# Patient Record
Sex: Female | Born: 1965 | Race: White | Hispanic: No | Marital: Single | Smoking: Former smoker
Health system: Southern US, Community
[De-identification: ages and names within clinical notes are randomized; demographics above are authoritative.]

## PROBLEM LIST (undated history)

## (undated) DIAGNOSIS — R519 Headache, unspecified: Secondary | ICD-10-CM

## (undated) DIAGNOSIS — G4730 Sleep apnea, unspecified: Secondary | ICD-10-CM

## (undated) DIAGNOSIS — K219 Gastro-esophageal reflux disease without esophagitis: Secondary | ICD-10-CM

---

## 1988-10-25 HISTORY — PX: ECTOPIC PREGNANCY SURGERY: SHX613

## 2021-06-22 ENCOUNTER — Emergency Department (HOSPITAL_COMMUNITY): Payer: BC Managed Care – PPO

## 2021-06-22 ENCOUNTER — Other Ambulatory Visit: Payer: Self-pay

## 2021-06-22 ENCOUNTER — Inpatient Hospital Stay (HOSPITAL_COMMUNITY)
Admission: EM | Admit: 2021-06-22 | Discharge: 2021-07-12 | DRG: 521 | Disposition: A | Discharge status: Rehabilitation Facility | Payer: BC Managed Care – PPO | Attending: Orthopaedic Surgery | Admitting: Orthopaedic Surgery

## 2021-06-22 DIAGNOSIS — S81802A Unspecified open wound, left lower leg, initial encounter: Secondary | ICD-10-CM

## 2021-06-22 DIAGNOSIS — S82401C Unspecified fracture of shaft of right fibula, initial encounter for open fracture type IIIA, IIIB, or IIIC: Secondary | ICD-10-CM

## 2021-06-22 DIAGNOSIS — K5903 Drug induced constipation: Secondary | ICD-10-CM | POA: Diagnosis present

## 2021-06-22 DIAGNOSIS — S82201C Unspecified fracture of shaft of right tibia, initial encounter for open fracture type IIIA, IIIB, or IIIC: Secondary | ICD-10-CM

## 2021-06-22 DIAGNOSIS — Z6841 Body Mass Index (BMI) 40.0 and over, adult: Secondary | ICD-10-CM

## 2021-06-22 DIAGNOSIS — D62 Acute posthemorrhagic anemia: Secondary | ICD-10-CM | POA: Diagnosis not present

## 2021-06-22 DIAGNOSIS — G4733 Obstructive sleep apnea (adult) (pediatric): Secondary | ICD-10-CM | POA: Diagnosis present

## 2021-06-22 DIAGNOSIS — I82441 Acute embolism and thrombosis of right tibial vein: Secondary | ICD-10-CM | POA: Diagnosis present

## 2021-06-22 DIAGNOSIS — Z419 Encounter for procedure for purposes other than remedying health state, unspecified: Secondary | ICD-10-CM

## 2021-06-22 DIAGNOSIS — Z23 Encounter for immunization: Secondary | ICD-10-CM

## 2021-06-22 DIAGNOSIS — Z96642 Presence of left artificial hip joint: Secondary | ICD-10-CM

## 2021-06-22 DIAGNOSIS — G8918 Other acute postprocedural pain: Secondary | ICD-10-CM | POA: Diagnosis not present

## 2021-06-22 DIAGNOSIS — S72002A Fracture of unspecified part of neck of left femur, initial encounter for closed fracture: Secondary | ICD-10-CM | POA: Diagnosis not present

## 2021-06-22 DIAGNOSIS — Z20822 Contact with and (suspected) exposure to covid-19: Secondary | ICD-10-CM | POA: Diagnosis present

## 2021-06-22 DIAGNOSIS — Z87891 Personal history of nicotine dependence: Secondary | ICD-10-CM

## 2021-06-22 DIAGNOSIS — K219 Gastro-esophageal reflux disease without esophagitis: Secondary | ICD-10-CM | POA: Diagnosis present

## 2021-06-22 DIAGNOSIS — T148XXA Other injury of unspecified body region, initial encounter: Secondary | ICD-10-CM

## 2021-06-22 DIAGNOSIS — T1490XA Injury, unspecified, initial encounter: Secondary | ICD-10-CM | POA: Diagnosis not present

## 2021-06-22 DIAGNOSIS — Y92488 Other paved roadways as the place of occurrence of the external cause: Secondary | ICD-10-CM

## 2021-06-22 DIAGNOSIS — Z9889 Other specified postprocedural states: Secondary | ICD-10-CM

## 2021-06-22 DIAGNOSIS — S82251C Displaced comminuted fracture of shaft of right tibia, initial encounter for open fracture type IIIA, IIIB, or IIIC: Secondary | ICD-10-CM | POA: Diagnosis present

## 2021-06-22 DIAGNOSIS — S82451C Displaced comminuted fracture of shaft of right fibula, initial encounter for open fracture type IIIA, IIIB, or IIIC: Secondary | ICD-10-CM | POA: Diagnosis present

## 2021-06-22 HISTORY — DX: Sleep apnea, unspecified: G47.30

## 2021-06-22 HISTORY — DX: Headache, unspecified: R51.9

## 2021-06-22 HISTORY — DX: Gastro-esophageal reflux disease without esophagitis: K21.9

## 2021-06-22 LAB — COMPREHENSIVE METABOLIC PANEL
ALT: 45 U/L — ABNORMAL HIGH (ref 0–44)
AST: 38 U/L (ref 15–41)
Albumin: 3.3 g/dL — ABNORMAL LOW (ref 3.5–5.0)
Alkaline Phosphatase: 69 U/L (ref 38–126)
Anion gap: 11 (ref 5–15)
BUN: 16 mg/dL (ref 6–20)
CO2: 23 mmol/L (ref 22–32)
Calcium: 8.5 mg/dL — ABNORMAL LOW (ref 8.9–10.3)
Chloride: 104 mmol/L (ref 98–111)
Creatinine, Ser: 1.25 mg/dL — ABNORMAL HIGH (ref 0.44–1.00)
GFR, Estimated: 50 mL/min — ABNORMAL LOW (ref >60)
Glucose, Bld: 220 mg/dL — ABNORMAL HIGH (ref 70–99)
Potassium: 3.7 mmol/L (ref 3.5–5.1)
Sodium: 138 mmol/L (ref 135–145)
Total Bilirubin: 0.7 mg/dL (ref 0.3–1.2)
Total Protein: 6.2 g/dL — ABNORMAL LOW (ref 6.5–8.1)

## 2021-06-22 LAB — RESP PANEL BY RT-PCR (FLU A&B, COVID) ARPGX2
Influenza A by PCR: NEGATIVE
Influenza B by PCR: NEGATIVE
SARS Coronavirus 2 by RT PCR: NEGATIVE

## 2021-06-22 LAB — CBC
HCT: 42.9 % (ref 36.0–46.0)
Hemoglobin: 13.5 g/dL (ref 12.0–15.0)
MCH: 28 pg (ref 26.0–34.0)
MCHC: 31.5 g/dL (ref 30.0–36.0)
MCV: 88.8 fL (ref 80.0–100.0)
Platelets: 449 10*3/uL — ABNORMAL HIGH (ref 150–400)
RBC: 4.83 MIL/uL (ref 3.87–5.11)
RDW: 13.1 % (ref 11.5–15.5)
WBC: 25.9 10*3/uL — ABNORMAL HIGH (ref 4.0–10.5)
nRBC: 0 % (ref 0.0–0.2)

## 2021-06-22 LAB — PROTIME-INR
INR: 1.1 (ref 0.8–1.2)
Prothrombin Time: 13.9 seconds (ref 11.4–15.2)

## 2021-06-22 LAB — I-STAT CHEM 8, ED
BUN: 17 mg/dL (ref 6–20)
Calcium, Ion: 1.01 mmol/L — ABNORMAL LOW (ref 1.15–1.40)
Chloride: 104 mmol/L (ref 98–111)
Creatinine, Ser: 1.2 mg/dL — ABNORMAL HIGH (ref 0.44–1.00)
Glucose, Bld: 217 mg/dL — ABNORMAL HIGH (ref 70–99)
HCT: 42 % (ref 36.0–46.0)
Hemoglobin: 14.3 g/dL (ref 12.0–15.0)
Potassium: 3.7 mmol/L (ref 3.5–5.1)
Sodium: 139 mmol/L (ref 135–145)
TCO2: 23 mmol/L (ref 22–32)

## 2021-06-22 LAB — LACTIC ACID, PLASMA: Lactic Acid, Venous: 3.4 mmol/L (ref 0.5–1.9)

## 2021-06-22 LAB — ETHANOL: Alcohol, Ethyl (B): <10 mg/dL (ref <10)

## 2021-06-22 MED ORDER — FENTANYL CITRATE PF 50 MCG/ML IJ SOSY
50.0000 ug | PREFILLED_SYRINGE | INTRAMUSCULAR | Status: DC | PRN
  Administered 2021-06-22 – 2021-06-23 (×2): 50 ug via INTRAVENOUS
  Filled 2021-06-22 (×2): qty 1

## 2021-06-22 MED ORDER — IOHEXOL 350 MG/ML SOLN
100.0000 mL | Freq: Once | INTRAVENOUS | Status: AC | PRN
  Administered 2021-06-22: 100 mL via INTRAVENOUS

## 2021-06-22 MED ORDER — FENTANYL CITRATE (PF) 100 MCG/2ML IJ SOLN
INTRAMUSCULAR | Status: AC
  Administered 2021-06-24: 50 ug via INTRAVENOUS
  Filled 2021-06-22: qty 2

## 2021-06-22 MED ORDER — FENTANYL CITRATE PF 50 MCG/ML IJ SOSY
100.0000 ug | PREFILLED_SYRINGE | Freq: Once | INTRAMUSCULAR | Status: AC
  Administered 2021-06-22: 100 ug via INTRAVENOUS

## 2021-06-22 MED ORDER — SODIUM CHLORIDE 0.9 % IV SOLN
INTRAVENOUS | Status: AC | PRN
  Administered 2021-06-22: 1000 mL via INTRAVENOUS

## 2021-06-22 MED ORDER — CEFAZOLIN SODIUM-DEXTROSE 2-4 GM/100ML-% IV SOLN
2.0000 g | Freq: Once | INTRAVENOUS | Status: AC
  Administered 2021-06-22: 2 g via INTRAVENOUS

## 2021-06-22 MED ORDER — LACTATED RINGERS IV BOLUS
1000.0000 mL | Freq: Once | INTRAVENOUS | Status: AC
Start: 2021-06-22 — End: 2021-06-23
  Administered 2021-06-23: 1000 mL via INTRAVENOUS

## 2021-06-22 MED ORDER — TETANUS-DIPHTH-ACELL PERTUSSIS 5-2.5-18.5 LF-MCG/0.5 IM SUSY
0.5000 mL | PREFILLED_SYRINGE | Freq: Once | INTRAMUSCULAR | Status: AC
  Administered 2021-06-22: 0.5 mL via INTRAMUSCULAR

## 2021-06-22 NOTE — ED Provider Notes (Signed)
ATTENDING SUPERVISORY NOTE I have personally viewed the imaging studies performed. I have personally seen and examined the patient, and discussed the plan of care with the resident.  I have reviewed the documentation of the resident and agree.  Trauma - Plan: DG Chest Port 1 [address], DG Pelvis Portable, DG FEMUR PORT 1V LEFT, DG Knee Left Port, DG Tibia/Fibula Left [place], DG FEMUR PORT, 1V RIGHT, DG Knee Right Port, DG Tibia/Fibula Right [place], DG Ankle Right [place], DG Foot Complete Right, DG Chest Port 1 [place], DG Pelvis Portable, DG FEMUR PORT 1V LEFT, DG Knee Left Port, DG Tibia/Fibula Left [place], DG FEMUR PORT, 1V RIGHT, DG Knee Right Port, DG Tibia/Fibula Right Port, DG Ankle Right Port, [place] Foot Complete Right  .Critical Care  Date/Time: 06/22/2021 11:03 PM Performed by: Blane Ohara, MD Authorized by: Blane Ohara, MD   Critical care provider statement:    Critical care time (minutes):  35   Critical care start time:  06/22/2021 10:25 PM   Critical care end time:  06/22/2021 11:00 PM   Critical care time was exclusive of:  Separately billable procedures and treating other patients and teaching time   Critical care was necessary to treat or prevent imminent or life-threatening deterioration of the following conditions:  Trauma   Critical care was time spent personally by me on the following activities:  Evaluation of patient's response to treatment, examination of patient, ordering and performing treatments and interventions, ordering and review of laboratory studies, ordering and review of radiographic studies, pulse oximetry, re-evaluation of patient's condition and obtaining history from patient or surrogate    Blane Ohara, MD 06/23/21 0008
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
## 2021-06-22 NOTE — ED Notes (Signed)
X-ray at bedside
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
## 2021-06-22 NOTE — ED Notes (Signed)
Patient transported to CT 

## 2021-06-23 ENCOUNTER — Inpatient Hospital Stay (HOSPITAL_COMMUNITY): Payer: BC Managed Care – PPO

## 2021-06-23 ENCOUNTER — Encounter (HOSPITAL_COMMUNITY)
Admission: EM | Disposition: A | Discharge status: Rehabilitation Facility | Payer: Self-pay | Source: Home / Self Care | Attending: Orthopaedic Surgery

## 2021-06-23 ENCOUNTER — Encounter (HOSPITAL_COMMUNITY): Payer: Self-pay | Admitting: Surgery

## 2021-06-23 ENCOUNTER — Emergency Department (HOSPITAL_COMMUNITY): Payer: BC Managed Care – PPO | Admitting: Anesthesiology

## 2021-06-23 DIAGNOSIS — T1490XA Injury, unspecified, initial encounter: Secondary | ICD-10-CM | POA: Diagnosis present

## 2021-06-23 DIAGNOSIS — Z20822 Contact with and (suspected) exposure to covid-19: Secondary | ICD-10-CM | POA: Diagnosis present

## 2021-06-23 DIAGNOSIS — G4733 Obstructive sleep apnea (adult) (pediatric): Secondary | ICD-10-CM | POA: Diagnosis present

## 2021-06-23 DIAGNOSIS — K5903 Drug induced constipation: Secondary | ICD-10-CM | POA: Diagnosis present

## 2021-06-23 DIAGNOSIS — D62 Acute posthemorrhagic anemia: Secondary | ICD-10-CM | POA: Diagnosis not present

## 2021-06-23 DIAGNOSIS — Z23 Encounter for immunization: Secondary | ICD-10-CM | POA: Diagnosis not present

## 2021-06-23 DIAGNOSIS — Z6841 Body Mass Index (BMI) 40.0 and over, adult: Secondary | ICD-10-CM | POA: Diagnosis not present

## 2021-06-23 DIAGNOSIS — S82251C Displaced comminuted fracture of shaft of right tibia, initial encounter for open fracture type IIIA, IIIB, or IIIC: Secondary | ICD-10-CM | POA: Diagnosis present

## 2021-06-23 DIAGNOSIS — S81802A Unspecified open wound, left lower leg, initial encounter: Secondary | ICD-10-CM

## 2021-06-23 DIAGNOSIS — I82441 Acute embolism and thrombosis of right tibial vein: Secondary | ICD-10-CM | POA: Diagnosis present

## 2021-06-23 DIAGNOSIS — Z9889 Other specified postprocedural states: Secondary | ICD-10-CM

## 2021-06-23 DIAGNOSIS — S82201C Unspecified fracture of shaft of right tibia, initial encounter for open fracture type IIIA, IIIB, or IIIC: Secondary | ICD-10-CM

## 2021-06-23 DIAGNOSIS — K219 Gastro-esophageal reflux disease without esophagitis: Secondary | ICD-10-CM | POA: Diagnosis present

## 2021-06-23 DIAGNOSIS — Y92488 Other paved roadways as the place of occurrence of the external cause: Secondary | ICD-10-CM | POA: Diagnosis not present

## 2021-06-23 DIAGNOSIS — S82451C Displaced comminuted fracture of shaft of right fibula, initial encounter for open fracture type IIIA, IIIB, or IIIC: Secondary | ICD-10-CM | POA: Diagnosis present

## 2021-06-23 DIAGNOSIS — S72002A Fracture of unspecified part of neck of left femur, initial encounter for closed fracture: Secondary | ICD-10-CM | POA: Diagnosis present

## 2021-06-23 DIAGNOSIS — T148XXA Other injury of unspecified body region, initial encounter: Secondary | ICD-10-CM | POA: Diagnosis not present

## 2021-06-23 DIAGNOSIS — G8918 Other acute postprocedural pain: Secondary | ICD-10-CM | POA: Diagnosis not present

## 2021-06-23 DIAGNOSIS — Z96642 Presence of left artificial hip joint: Secondary | ICD-10-CM | POA: Diagnosis not present

## 2021-06-23 DIAGNOSIS — S82401C Unspecified fracture of shaft of right fibula, initial encounter for open fracture type IIIA, IIIB, or IIIC: Secondary | ICD-10-CM

## 2021-06-23 DIAGNOSIS — Z87891 Personal history of nicotine dependence: Secondary | ICD-10-CM | POA: Diagnosis not present

## 2021-06-23 HISTORY — PX: EXTERNAL FIXATION LEG: SHX1549

## 2021-06-23 HISTORY — PX: I & D EXTREMITY: SHX5045

## 2021-06-23 LAB — CBC
HCT: 35.7 % — ABNORMAL LOW (ref 36.0–46.0)
Hemoglobin: 11.6 g/dL — ABNORMAL LOW (ref 12.0–15.0)
MCH: 28 pg (ref 26.0–34.0)
MCHC: 32.5 g/dL (ref 30.0–36.0)
MCV: 86 fL (ref 80.0–100.0)
Platelets: 321 10*3/uL (ref 150–400)
RBC: 4.15 MIL/uL (ref 3.87–5.11)
RDW: 13.2 % (ref 11.5–15.5)
WBC: 10.6 10*3/uL — ABNORMAL HIGH (ref 4.0–10.5)
nRBC: 0 % (ref 0.0–0.2)

## 2021-06-23 LAB — BASIC METABOLIC PANEL
Anion gap: 11 (ref 5–15)
BUN: 13 mg/dL (ref 6–20)
CO2: 20 mmol/L — ABNORMAL LOW (ref 22–32)
Calcium: 8.1 mg/dL — ABNORMAL LOW (ref 8.9–10.3)
Chloride: 106 mmol/L (ref 98–111)
Creatinine, Ser: 0.96 mg/dL (ref 0.44–1.00)
GFR, Estimated: >60 mL/min (ref >60)
Glucose, Bld: 215 mg/dL — ABNORMAL HIGH (ref 70–99)
Potassium: 4.1 mmol/L (ref 3.5–5.1)
Sodium: 137 mmol/L (ref 135–145)

## 2021-06-23 SURGERY — IRRIGATION AND DEBRIDEMENT EXTREMITY
Anesthesia: General | Site: Leg Lower | Laterality: Right

## 2021-06-23 MED ORDER — MIDAZOLAM HCL 2 MG/2ML IJ SOLN
INTRAMUSCULAR | Status: AC
  Filled 2021-06-23: qty 2

## 2021-06-23 MED ORDER — ONDANSETRON HCL 4 MG/2ML IJ SOLN: 4.0000 mg | Freq: Four times a day (QID) | INTRAMUSCULAR | Status: DC | PRN

## 2021-06-23 MED ORDER — HYDROMORPHONE HCL 1 MG/ML IJ SOLN
0.5000 mg | INTRAMUSCULAR | Status: DC | PRN
  Administered 2021-06-23 – 2021-06-24 (×3): 1 mg via INTRAVENOUS
  Filled 2021-06-23 (×3): qty 1

## 2021-06-23 MED ORDER — EPHEDRINE 5 MG/ML INJ
INTRAVENOUS | Status: AC
  Filled 2021-06-23: qty 5

## 2021-06-23 MED ORDER — SODIUM CHLORIDE 0.9 % IR SOLN
Status: DC | PRN
  Administered 2021-06-23: 3000 mL

## 2021-06-23 MED ORDER — CEFTRIAXONE SODIUM 2 G IJ SOLR
2.0000 g | INTRAMUSCULAR | Status: DC
  Administered 2021-06-24: 2 g via INTRAVENOUS
  Filled 2021-06-23 (×2): qty 20

## 2021-06-23 MED ORDER — ACETAMINOPHEN 325 MG PO TABS: 325.0000 mg | ORAL_TABLET | Freq: Four times a day (QID) | ORAL | Status: DC | PRN

## 2021-06-23 MED ORDER — DEXAMETHASONE SODIUM PHOSPHATE 10 MG/ML IJ SOLN
INTRAMUSCULAR | Status: AC
  Filled 2021-06-23: qty 2

## 2021-06-23 MED ORDER — OXYCODONE HCL 5 MG PO TABS
5.0000 mg | ORAL_TABLET | ORAL | Status: DC | PRN
  Administered 2021-06-23: 10 mg via ORAL
  Filled 2021-06-23: qty 2

## 2021-06-23 MED ORDER — MIDAZOLAM HCL 2 MG/2ML IJ SOLN
INTRAMUSCULAR | Status: DC | PRN
  Administered 2021-06-23: 2 mg via INTRAVENOUS

## 2021-06-23 MED ORDER — LIDOCAINE 2% (20 MG/ML) 5 ML SYRINGE
INTRAMUSCULAR | Status: AC
  Filled 2021-06-23: qty 10

## 2021-06-23 MED ORDER — OXYCODONE HCL 5 MG PO TABS
10.0000 mg | ORAL_TABLET | ORAL | Status: DC | PRN
  Administered 2021-06-24: 15 mg via ORAL
  Filled 2021-06-23: qty 3

## 2021-06-23 MED ORDER — METOCLOPRAMIDE HCL 5 MG PO TABS: 5.0000 mg | ORAL_TABLET | Freq: Three times a day (TID) | ORAL | Status: DC | PRN

## 2021-06-23 MED ORDER — ONDANSETRON HCL 4 MG PO TABS: 4.0000 mg | ORAL_TABLET | Freq: Four times a day (QID) | ORAL | Status: DC | PRN

## 2021-06-23 MED ORDER — LACTATED RINGERS IV SOLN: INTRAVENOUS | Status: DC | PRN

## 2021-06-23 MED ORDER — METOCLOPRAMIDE HCL 5 MG/ML IJ SOLN: 5.0000 mg | Freq: Three times a day (TID) | INTRAMUSCULAR | Status: DC | PRN

## 2021-06-23 MED ORDER — DIPHENHYDRAMINE HCL 12.5 MG/5ML PO ELIX: 12.5000 mg | ORAL_SOLUTION | ORAL | Status: DC | PRN

## 2021-06-23 MED ORDER — FENTANYL CITRATE (PF) 100 MCG/2ML IJ SOLN
50.0000 ug | INTRAMUSCULAR | Status: DC | PRN
  Administered 2021-06-23: 50 ug via INTRAVENOUS
  Filled 2021-06-23: qty 2

## 2021-06-23 MED ORDER — PROPOFOL 10 MG/ML IV BOLUS
INTRAVENOUS | Status: AC
  Filled 2021-06-23: qty 20

## 2021-06-23 MED ORDER — HYDRALAZINE HCL 50 MG PO TABS
50.0000 mg | ORAL_TABLET | Freq: Four times a day (QID) | ORAL | Status: DC | PRN
  Administered 2021-06-23: 50 mg via ORAL
  Filled 2021-06-23: qty 1

## 2021-06-23 MED ORDER — PHENYLEPHRINE HCL (PRESSORS) 10 MG/ML IV SOLN
INTRAVENOUS | Status: DC | PRN
  Administered 2021-06-23: 80 ug via INTRAVENOUS
  Administered 2021-06-23 (×2): 40 ug via INTRAVENOUS

## 2021-06-23 MED ORDER — KETAMINE HCL 50 MG/5ML IJ SOSY
PREFILLED_SYRINGE | INTRAMUSCULAR | Status: AC
  Filled 2021-06-23: qty 5

## 2021-06-23 MED ORDER — ONDANSETRON HCL 4 MG/2ML IJ SOLN
INTRAMUSCULAR | Status: AC
  Administered 2021-06-23: 4 mg via INTRAVENOUS
  Filled 2021-06-23: qty 2

## 2021-06-23 MED ORDER — HYDROMORPHONE HCL 1 MG/ML IJ SOLN
0.2500 mg | INTRAMUSCULAR | Status: DC | PRN
  Administered 2021-06-23: 0.5 mg via INTRAVENOUS
  Administered 2021-06-23: 0.25 mg via INTRAVENOUS
  Administered 2021-06-23: 0.5 mg via INTRAVENOUS

## 2021-06-23 MED ORDER — LIDOCAINE HCL (CARDIAC) PF 100 MG/5ML IV SOSY
PREFILLED_SYRINGE | INTRAVENOUS | Status: DC | PRN
  Administered 2021-06-23: 50 mg via INTRAVENOUS

## 2021-06-23 MED ORDER — HYDROMORPHONE HCL 1 MG/ML IJ SOLN
INTRAMUSCULAR | Status: AC
  Filled 2021-06-23: qty 1

## 2021-06-23 MED ORDER — DEXAMETHASONE SODIUM PHOSPHATE 10 MG/ML IJ SOLN
INTRAMUSCULAR | Status: DC | PRN
  Administered 2021-06-23: 10 mg via INTRAVENOUS

## 2021-06-23 MED ORDER — PROPOFOL 10 MG/ML IV BOLUS
INTRAVENOUS | Status: DC | PRN
  Administered 2021-06-23: 200 mg via INTRAVENOUS

## 2021-06-23 MED ORDER — FENTANYL CITRATE PF 50 MCG/ML IJ SOSY
100.0000 ug | PREFILLED_SYRINGE | Freq: Once | INTRAMUSCULAR | Status: AC
  Administered 2021-06-23: 100 ug via INTRAVENOUS
  Filled 2021-06-23: qty 2

## 2021-06-23 MED ORDER — SODIUM CHLORIDE 0.9 % IV SOLN
2.0000 g | INTRAVENOUS | Status: DC
  Administered 2021-06-23: 2 g via INTRAVENOUS
  Filled 2021-06-23: qty 20

## 2021-06-23 MED ORDER — KETAMINE HCL 10 MG/ML IJ SOLN
INTRAMUSCULAR | Status: DC | PRN
  Administered 2021-06-23 (×3): 10 mg via INTRAVENOUS

## 2021-06-23 MED ORDER — ONDANSETRON HCL 4 MG/2ML IJ SOLN
INTRAMUSCULAR | Status: AC
  Filled 2021-06-23: qty 4

## 2021-06-23 MED ORDER — ONDANSETRON HCL 4 MG/2ML IJ SOLN: 4.0000 mg | Freq: Once | INTRAMUSCULAR | Status: AC

## 2021-06-23 MED ORDER — PHENYLEPHRINE 40 MCG/ML (10ML) SYRINGE FOR IV PUSH (FOR BLOOD PRESSURE SUPPORT)
PREFILLED_SYRINGE | INTRAVENOUS | Status: AC
  Filled 2021-06-23: qty 10

## 2021-06-23 MED ORDER — FENTANYL CITRATE (PF) 250 MCG/5ML IJ SOLN
INTRAMUSCULAR | Status: DC | PRN
  Administered 2021-06-23 (×2): 100 ug via INTRAVENOUS
  Administered 2021-06-23: 50 ug via INTRAVENOUS

## 2021-06-23 MED ORDER — CEFAZOLIN SODIUM-DEXTROSE 2-3 GM-%(50ML) IV SOLR
INTRAVENOUS | Status: DC | PRN
  Administered 2021-06-23: 2 g via INTRAVENOUS

## 2021-06-23 MED ORDER — METHOCARBAMOL 500 MG PO TABS
500.0000 mg | ORAL_TABLET | Freq: Four times a day (QID) | ORAL | Status: DC | PRN
  Administered 2021-06-23 – 2021-06-24 (×2): 500 mg via ORAL
  Filled 2021-06-23 (×2): qty 1

## 2021-06-23 MED ORDER — PANTOPRAZOLE SODIUM 40 MG PO TBEC
40.0000 mg | DELAYED_RELEASE_TABLET | Freq: Every day | ORAL | Status: DC
  Administered 2021-06-23 – 2021-07-12 (×18): 40 mg via ORAL
  Filled 2021-06-23 (×5): qty 1
  Filled 2021-06-23: qty 2
  Filled 2021-06-23 (×13): qty 1

## 2021-06-23 MED ORDER — METHOCARBAMOL 1000 MG/10ML IJ SOLN
500.0000 mg | Freq: Four times a day (QID) | INTRAVENOUS | Status: DC | PRN
  Filled 2021-06-23: qty 5

## 2021-06-23 MED ORDER — SUCCINYLCHOLINE CHLORIDE 200 MG/10ML IV SOSY
PREFILLED_SYRINGE | INTRAVENOUS | Status: AC
  Filled 2021-06-23: qty 10

## 2021-06-23 MED ORDER — DOCUSATE SODIUM 100 MG PO CAPS
100.0000 mg | ORAL_CAPSULE | Freq: Two times a day (BID) | ORAL | Status: DC
  Administered 2021-06-23 (×2): 100 mg via ORAL
  Filled 2021-06-23 (×3): qty 1

## 2021-06-23 MED ORDER — OXYCODONE HCL 5 MG PO TABS: 5.0000 mg | ORAL_TABLET | Freq: Once | ORAL | Status: DC | PRN

## 2021-06-23 MED ORDER — SUCCINYLCHOLINE 20MG/ML (10ML) SYRINGE FOR MEDFUSION PUMP - OPTIME
INTRAMUSCULAR | Status: DC | PRN
  Administered 2021-06-23: 140 mg via INTRAVENOUS

## 2021-06-23 MED ORDER — ONDANSETRON HCL 4 MG/2ML IJ SOLN
INTRAMUSCULAR | Status: DC | PRN
  Administered 2021-06-23: 4 mg via INTRAVENOUS

## 2021-06-23 MED ORDER — FENTANYL CITRATE (PF) 250 MCG/5ML IJ SOLN
INTRAMUSCULAR | Status: AC
  Filled 2021-06-23: qty 5

## 2021-06-23 MED ORDER — CEFAZOLIN SODIUM-DEXTROSE 2-4 GM/100ML-% IV SOLN
INTRAVENOUS | Status: AC
  Filled 2021-06-23: qty 100

## 2021-06-23 MED ORDER — FENTANYL CITRATE PF 50 MCG/ML IJ SOSY: 50.0000 ug | PREFILLED_SYRINGE | INTRAMUSCULAR | Status: DC | PRN

## 2021-06-23 MED ORDER — OXYCODONE HCL 5 MG/5ML PO SOLN
5.0000 mg | Freq: Once | ORAL | Status: DC | PRN
Start: 2021-06-23 — End: 2021-06-23

## 2021-06-23 MED ORDER — SODIUM CHLORIDE 0.9 % IV SOLN: INTRAVENOUS | Status: DC

## 2021-06-23 SURGICAL SUPPLY — 72 items
BAG COUNTER SPONGE SURGICOUNT (BAG) ×3 IMPLANT
BAG SPNG CNTER NS LX DISP (BAG) ×2
BAR EXFX 600X11 NS LF (EXFIX) ×4
BAR GLASS FIBER EXFX 11X600 (EXFIX) ×6 IMPLANT
BLADE SURG 10 STRL SS (BLADE) ×3 IMPLANT
BNDG CMPR MED 10X6 ELC LF (GAUZE/BANDAGES/DRESSINGS) ×8
BNDG COHESIVE 4X5 TAN STRL (GAUZE/BANDAGES/DRESSINGS) ×3 IMPLANT
BNDG COHESIVE 6X5 TAN STRL LF (GAUZE/BANDAGES/DRESSINGS) ×6 IMPLANT
BNDG ELASTIC 3X5.8 VLCR STR LF (GAUZE/BANDAGES/DRESSINGS) IMPLANT
BNDG ELASTIC 6X10 VLCR STRL LF (GAUZE/BANDAGES/DRESSINGS) ×12 IMPLANT
COVER SURGICAL LIGHT HANDLE (MISCELLANEOUS) ×3 IMPLANT
CUFF TOURN SGL QUICK 18X4 (TOURNIQUET CUFF) IMPLANT
CUFF TOURN SGL QUICK 34 (TOURNIQUET CUFF)
CUFF TRNQT CYL 34X4.125X (TOURNIQUET CUFF) IMPLANT
DRAPE BILATERAL LIMB T (DRAPES) ×3 IMPLANT
DRAPE C-ARM 42X72 X-RAY (DRAPES) ×3 IMPLANT
DRAPE ORTHO SPLIT 77X108 STRL (DRAPES) ×6
DRAPE SURG 17X23 STRL (DRAPES) IMPLANT
DRAPE SURG ORHT 6 SPLT 77X108 (DRAPES) ×4 IMPLANT
DRAPE U-SHAPE 47X51 STRL (DRAPES) ×3 IMPLANT
DURAPREP 26ML APPLICATOR (WOUND CARE) IMPLANT
ELECT REM PT RETURN 9FT ADLT (ELECTROSURGICAL)
ELECTRODE REM PT RTRN 9FT ADLT (ELECTROSURGICAL) IMPLANT
GAUZE SPONGE 4X4 12PLY STRL (GAUZE/BANDAGES/DRESSINGS) ×6 IMPLANT
GAUZE XEROFORM 1X8 LF (GAUZE/BANDAGES/DRESSINGS) ×3 IMPLANT
GAUZE XEROFORM 5X9 LF (GAUZE/BANDAGES/DRESSINGS) ×6 IMPLANT
GLOVE EUDERMIC 6.5 POWDERFREE (GLOVE) ×3 IMPLANT
GLOVE OPTIFIT SS 6.0 STRL BRWN (GLOVE) ×3 IMPLANT
GLOVE OPTIFIT SS 7.5 STRL LX (GLOVE) ×3 IMPLANT
GLOVE OPTIFIT SS 8.0 STRL (GLOVE) ×6 IMPLANT
GLOVE OPTIFIT SS 8.5 STRL (GLOVE) ×3 IMPLANT
GLOVE SRG 8 PF TXTR STRL LF DI (GLOVE) ×4 IMPLANT
GLOVE SURG ENC MOIS LTX SZ8 (GLOVE) ×3 IMPLANT
GLOVE SURG ORTHO LTX SZ7.5 (GLOVE) ×3 IMPLANT
GLOVE SURG UNDER POLY LF SZ8 (GLOVE) ×6
GOWN STRL REUS W/ TWL LRG LVL3 (GOWN DISPOSABLE) ×2 IMPLANT
GOWN STRL REUS W/ TWL XL LVL3 (GOWN DISPOSABLE) ×8 IMPLANT
GOWN STRL REUS W/TWL LRG LVL3 (GOWN DISPOSABLE) ×3
GOWN STRL REUS W/TWL XL LVL3 (GOWN DISPOSABLE) ×12
HANDPIECE INTERPULSE COAX TIP (DISPOSABLE)
HOVERMATT SINGLE USE (MISCELLANEOUS) ×3 IMPLANT
KIT BASIN OR (CUSTOM PROCEDURE TRAY) ×3 IMPLANT
KIT TURNOVER KIT B (KITS) ×3 IMPLANT
MANIFOLD NEPTUNE II (INSTRUMENTS) ×3 IMPLANT
NS IRRIG 1000ML POUR BTL (IV SOLUTION) ×3 IMPLANT
PACK ORTHO EXTREMITY (CUSTOM PROCEDURE TRAY) ×3 IMPLANT
PAD ABD 8X10 STRL (GAUZE/BANDAGES/DRESSINGS) ×15 IMPLANT
PAD ARMBOARD 7.5X6 YLW CONV (MISCELLANEOUS) ×6 IMPLANT
PADDING CAST ABS 4INX4YD NS (CAST SUPPLIES) ×2
PADDING CAST ABS COTTON 4X4 ST (CAST SUPPLIES) ×4 IMPLANT
PADDING CAST COTTON 6X4 STRL (CAST SUPPLIES) ×6 IMPLANT
PADDING CAST SYN 6 (CAST SUPPLIES) ×3
PADDING CAST SYNTHETIC 6X4 NS (CAST SUPPLIES) ×6 IMPLANT
PIN CLAMP 2BAR 75MM BLUE (EXFIX) ×6 IMPLANT
PIN HALF 5X200X85MM EXFIX (EXFIX) ×6 IMPLANT
PIN HALF YELLOW 5X160X35 (EXFIX) ×6 IMPLANT
SET CYSTO W/LG BORE CLAMP LF (SET/KITS/TRAYS/PACK) ×3 IMPLANT
SET HNDPC FAN SPRY TIP SCT (DISPOSABLE) IMPLANT
SOL PREP PROV IODINE SCRUB 4OZ (MISCELLANEOUS) ×3 IMPLANT
SPONGE T-LAP 18X18 ~~LOC~~+RFID (SPONGE) ×6 IMPLANT
STOCKINETTE IMPERVIOUS 9X36 MD (GAUZE/BANDAGES/DRESSINGS) ×3 IMPLANT
SUT ETHILON 2 0 FS 18 (SUTURE) ×15 IMPLANT
SUT ETHILON 3 0 PS 1 (SUTURE) IMPLANT
SUT VIC AB 2-0 CT1 27 (SUTURE)
SUT VIC AB 2-0 CT1 TAPERPNT 27 (SUTURE) IMPLANT
SWAB CULTURE ESWAB REG 1ML (MISCELLANEOUS) IMPLANT
TOWEL GREEN STERILE (TOWEL DISPOSABLE) ×3 IMPLANT
TOWEL GREEN STERILE FF (TOWEL DISPOSABLE) ×3 IMPLANT
TUBE CONNECTING 12X1/4 (SUCTIONS) ×3 IMPLANT
UNDERPAD 30X36 HEAVY ABSORB (UNDERPADS AND DIAPERS) ×3 IMPLANT
WATER STERILE IRR 1000ML POUR (IV SOLUTION) ×3 IMPLANT
YANKAUER SUCT BULB TIP NO VENT (SUCTIONS) ×3 IMPLANT

## 2021-06-23 NOTE — Anesthesia Preprocedure Evaluation (Signed)
Anesthesia Evaluation  Patient identified by MRN, date of birth, ID band Patient awake    Reviewed: Allergy & Precautions, H&P , NPO status , Patient's Chart, lab work & pertinent test results  Airway Mallampati: II   Neck ROM: full    Dental   Pulmonary neg pulmonary ROS,    breath sounds clear to auscultation       Cardiovascular negative cardio ROS   Rhythm:regular Rate:Normal     Neuro/Psych    GI/Hepatic   Endo/Other  Morbid obesity  Renal/GU      Musculoskeletal   Abdominal   Peds  Hematology   Anesthesia Other Findings   Reproductive/Obstetrics                             Anesthesia Physical Anesthesia Plan  ASA: 2  Anesthesia Plan: General   Post-op Pain Management:    Induction: Intravenous  PONV Risk Score and Plan: 3 and Ondansetron, Dexamethasone, Midazolam and Treatment may vary due to age or medical condition  Airway Management Planned: Oral ETT  Additional Equipment:   Intra-op Plan:   Post-operative Plan: Extubation in OR  Informed Consent: I have reviewed the patients History and Physical, chart, labs and discussed the procedure including the risks, benefits and alternatives for the proposed anesthesia with the patient or authorized representative who has indicated his/her understanding and acceptance.     Dental advisory given  Plan Discussed with: CRNA, Anesthesiologist and Surgeon  Anesthesia Plan Comments:         Anesthesia Quick Evaluation
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
## 2021-06-23 NOTE — Plan of Care (Signed)
  Problem: Education: Goal: Knowledge of General Education information will improve Description Including pain rating scale, medication(s)/side effects and non-pharmacologic comfort measures Outcome: Progressing   
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
## 2021-06-23 NOTE — Progress Notes (Deleted)
Orthopedic Tech Progress Note Patient Details:  Cindy Valdez [birth year] [id number]  Ortho Devices Type of Ortho Device: Knee Immobilizer Ortho Device/Splint Location: bi-lateral. I assisted dr Magnus Ivan with dressing and knee immobilizer application. Ortho Device/Splint Interventions: Ordered, Application, Adjustment   Post Interventions Patient Tolerated: Well  [name] 06/23/2021, 2:40 AM

## 2021-06-23 NOTE — Brief Op Note (Signed)
06/23/2021  4:38 AM  PATIENT:  Cindy Valdez  55 y.o. female  PRE-OPERATIVE DIAGNOSIS:  Right open wound tibia/fibula fractures, left leg wound  POST-OPERATIVE DIAGNOSIS:  Right open wound tibia/fibula fractures, left leg wound  PROCEDURE:  Procedure(s): IRRIGATION AND DEBRIDEMENT EXTREMITY (Bilateral) EXTERNAL FIXATION LEG (Right)  SURGEON:  Surgeon(s) and Role:    Kathryne Hitch, MD - Primary  PHYSICIAN ASSISTANT: Rexene Edison, PA-C  ANESTHESIA:   general  COUNTS:  YES  DICTATION: .Other Dictation: Dictation Number [id number]  PLAN OF CARE: Admit to inpatient   PATIENT DISPOSITION:  PACU - hemodynamically stable.   Delay start of Pharmacological VTE agent (>24hrs) due to surgical blood loss or risk of bleeding: no

## 2021-06-23 NOTE — ED Notes (Signed)
Pt to OR via stretcher.

## 2021-06-23 NOTE — Progress Notes (Signed)
Orthopedic Tech Progress Note Patient Details:  Cindy Valdez 1965/12/04 [id number]  Ortho Devices Type of Ortho Device: Post (long leg) splint Ortho Device/Splint Location: rle Ortho Device/Splint Interventions: Ordered, Application, Adjustment   Post Interventions Patient Tolerated: Well  [name] 06/23/2021, 2:45 AM

## 2021-06-23 NOTE — Progress Notes (Signed)
   06/23/21 1021  Assess: MEWS Score  Temp 99.1 F (37.3 C)  BP (!) 147/84  Pulse Rate (!) 105  Resp 18  SpO2 94 %  O2 Device Room Air  Assess: MEWS Score  MEWS Temp 0  MEWS Systolic 0  MEWS Pulse 1  MEWS RR 0  MEWS LOC 1  MEWS Score 2  MEWS Score Color Yellow  Assess: if the MEWS score is Yellow or Red  Were vital signs taken at a resting state? Yes  Focused Assessment No change from prior assessment  Early Detection of Sepsis Score *See Row Information* Low  MEWS guidelines implemented *See Row Information* Yes  Treat  MEWS Interventions Administered prn meds/treatments  Pain Scale 0-10  Pain Score 8  Pain Type Surgical pain  Pain Location Leg  Pain Orientation Right;Left  Pain Descriptors / Indicators Aching  Take Vital Signs  Increase Vital Sign Frequency  Yellow: Q 2hr X 2 then Q 4hr X 2, if remains yellow, continue Q 4hrs  Escalate  MEWS: Escalate Yellow: discuss with charge nurse/RN and consider discussing with provider and RRT  Notify: Charge Nurse/RN  Name of Charge Nurse/RN Notified Kirtland Bouchard  Date Charge Nurse/RN Notified 06/23/21  Time Charge Nurse/RN Notified 1049  Notify: Provider  Provider Name/Title Dr Magnus Ivan  Date Provider Notified 06/23/21  Time Provider Notified 1051  Notification Type Page  Notification Reason Other (Comment) (yellow MEWS)  Provider response See new orders  Date of Provider Response 06/23/21  Time of Provider Response 1055  Document  Progress note created (see row info) Yes  Pt turned yellow MEWS upon return to unit as charted above. Medicated with prn pain meds. MD notified of MEWS status.
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
## 2021-06-23 NOTE — Transfer of Care (Signed)
Immediate Anesthesia Transfer of Care Note  Patient: Cindy Valdez  Procedure(s) Performed: IRRIGATION AND DEBRIDEMENT EXTREMITY (Bilateral: Leg Lower) EXTERNAL FIXATION LEG (Right: Leg Lower)  Patient Location: PACU  Anesthesia Type:General  Level of Consciousness: awake, alert  and oriented  Airway & Oxygen Therapy: Patient Spontanous Breathing  Post-op Assessment: Report given to RN and Post -op Vital signs reviewed and stable  Post vital signs: Reviewed and stable  Last Vitals:  Vitals Value Taken Time  BP 147/85 06/23/21 0440  Temp    Pulse 101 06/23/21 0441  Resp 18 06/23/21 0441  SpO2 95 % 06/23/21 0441  Vitals shown include unvalidated device data.  Last Pain:  Vitals:   06/23/21 0214  TempSrc:   PainSc: 10-Worst pain ever         Complications: No notable events documented.

## 2021-06-23 NOTE — Progress Notes (Signed)
Patient ID: Cindy Valdez, female   DOB: [birth year], 55 y.o.   MRN: [id number] I have consulted the orthopedic trauma service due to their expertise in taking care of complex injuries such as this patient's right comminuted/segmental open tib-fib fracture.  This injury is quite complex and needs the assessment of a fellowship trained orthopedic trauma specialist.  This type of injury is out of my normal scope of practice.  She will still need a left total hip arthroplasty to treat her displaced left hip femoral neck fracture and surgical plans we made accordingly and in conjunction with the Ortho trauma specialist.

## 2021-06-23 NOTE — ED Notes (Signed)
Ortho PA at bedside, spoke w/pt's family and explained procedure. Consent form signed.

## 2021-06-23 NOTE — Progress Notes (Signed)
Ortho Trauma Note  Reviewed imaging and case with Dr. Magnus Ivan. Patient with open segmental tibia/fibula fracture. Will require definitive IMN and removal of external fixation. I will try to proceed with surgery tomorrow pending OR availability. I will defer to Dr. Magnus Ivan regarding the total hip but agree in patients age and amount of displacement that total hip arthroplasty is standard of care vs trying to proceed with fixation. She is at significant risk for AVN and nonunion with attempted fixation of the femoral neck.  NPO at midnight. Ceftriaxone for open fracture prophylaxis.  Roby Lofts, MD [facility] [phone] (office) [email or web address]

## 2021-06-23 NOTE — Anesthesia Procedure Notes (Signed)
Procedure Name: Intubation Date/Time: 06/23/2021 3:16 AM Performed by: Molli Hazard, CRNA Pre-anesthesia Checklist: Patient identified, Emergency Drugs available, Suction available and Patient being monitored Patient Re-evaluated:Patient Re-evaluated prior to induction Oxygen Delivery Method: Circle system utilized Preoxygenation: Pre-oxygenation with 100% oxygen Induction Type: IV induction and Rapid sequence Laryngoscope Size: Glidescope Grade View: Grade I Tube type: Oral Tube size: 7.5 mm Number of attempts: 1 Airway Equipment and Method: Stylet Placement Confirmation: ETT inserted through vocal cords under direct vision and positive ETCO2 Secured at: 24 cm Tube secured with: Tape Dental Injury: Teeth and Oropharynx as per pre-operative assessment
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
## 2021-06-24 ENCOUNTER — Inpatient Hospital Stay (HOSPITAL_COMMUNITY): Payer: BC Managed Care – PPO

## 2021-06-24 ENCOUNTER — Encounter (HOSPITAL_COMMUNITY): Payer: Self-pay | Admitting: Orthopaedic Surgery

## 2021-06-24 ENCOUNTER — Encounter (HOSPITAL_COMMUNITY)
Admission: EM | Disposition: A | Discharge status: Rehabilitation Facility | Payer: Self-pay | Source: Home / Self Care | Attending: Orthopaedic Surgery

## 2021-06-24 ENCOUNTER — Inpatient Hospital Stay (HOSPITAL_COMMUNITY): Payer: BC Managed Care – PPO | Admitting: Anesthesiology

## 2021-06-24 DIAGNOSIS — S72002A Fracture of unspecified part of neck of left femur, initial encounter for closed fracture: Secondary | ICD-10-CM

## 2021-06-24 HISTORY — PX: EXTERNAL FIXATION REMOVAL: SHX5040

## 2021-06-24 HISTORY — PX: TIBIA IM NAIL INSERTION: SHX2516

## 2021-06-24 LAB — VITAMIN D 25 HYDROXY (VIT D DEFICIENCY, FRACTURES): Vit D, 25-Hydroxy: 56.29 ng/mL (ref 30–100)

## 2021-06-24 SURGERY — INSERTION, INTRAMEDULLARY ROD, TIBIA
Anesthesia: General | Site: Leg Lower | Laterality: Right

## 2021-06-24 MED ORDER — ACETAMINOPHEN 500 MG PO TABS
1000.0000 mg | ORAL_TABLET | Freq: Three times a day (TID) | ORAL | Status: DC
  Administered 2021-06-24 – 2021-07-12 (×52): 1000 mg via ORAL
  Filled 2021-06-24 (×55): qty 2

## 2021-06-24 MED ORDER — VANCOMYCIN HCL 1000 MG IV SOLR
INTRAVENOUS | Status: DC | PRN
  Administered 2021-06-24: 1000 mg

## 2021-06-24 MED ORDER — PROPOFOL 10 MG/ML IV BOLUS
INTRAVENOUS | Status: DC | PRN
  Administered 2021-06-24: 150 mg via INTRAVENOUS

## 2021-06-24 MED ORDER — SCOPOLAMINE 1 MG/3DAYS TD PT72: 1.0000 | MEDICATED_PATCH | Freq: Once | TRANSDERMAL | Status: DC

## 2021-06-24 MED ORDER — OXYCODONE HCL 5 MG PO TABS: 10.0000 mg | ORAL_TABLET | ORAL | Status: DC | PRN

## 2021-06-24 MED ORDER — DROPERIDOL 2.5 MG/ML IJ SOLN: 0.6250 mg | Freq: Once | INTRAMUSCULAR | Status: DC | PRN

## 2021-06-24 MED ORDER — ONDANSETRON HCL 4 MG/2ML IJ SOLN
INTRAMUSCULAR | Status: AC
  Filled 2021-06-24: qty 2

## 2021-06-24 MED ORDER — FENTANYL CITRATE (PF) 100 MCG/2ML IJ SOLN
INTRAMUSCULAR | Status: AC
  Administered 2021-06-25: 100 ug via INTRAVENOUS
  Filled 2021-06-24: qty 2

## 2021-06-24 MED ORDER — SUGAMMADEX SODIUM 200 MG/2ML IV SOLN
INTRAVENOUS | Status: DC | PRN
  Administered 2021-06-24: 200 mg via INTRAVENOUS

## 2021-06-24 MED ORDER — PROMETHAZINE HCL 25 MG/ML IJ SOLN: 6.2500 mg | INTRAMUSCULAR | Status: DC | PRN

## 2021-06-24 MED ORDER — FENTANYL CITRATE (PF) 100 MCG/2ML IJ SOLN
INTRAMUSCULAR | Status: AC
  Administered 2021-06-24: 50 ug via INTRAVENOUS
  Filled 2021-06-24: qty 2

## 2021-06-24 MED ORDER — VANCOMYCIN HCL 1000 MG IV SOLR
INTRAVENOUS | Status: AC
  Filled 2021-06-24: qty 20

## 2021-06-24 MED ORDER — DEXAMETHASONE SODIUM PHOSPHATE 10 MG/ML IJ SOLN
INTRAMUSCULAR | Status: AC
  Filled 2021-06-24: qty 1

## 2021-06-24 MED ORDER — MIDAZOLAM HCL 2 MG/2ML IJ SOLN
INTRAMUSCULAR | Status: AC
  Filled 2021-06-24: qty 2

## 2021-06-24 MED ORDER — FENTANYL CITRATE (PF) 100 MCG/2ML IJ SOLN
INTRAMUSCULAR | Status: DC | PRN
  Administered 2021-06-24: 100 ug via INTRAVENOUS
  Administered 2021-06-24: 50 ug via INTRAVENOUS
  Administered 2021-06-24: 100 ug via INTRAVENOUS
  Administered 2021-06-24: 50 ug via INTRAVENOUS
  Administered 2021-06-24: 100 ug via INTRAVENOUS
  Administered 2021-06-24: 50 ug via INTRAVENOUS

## 2021-06-24 MED ORDER — PROPOFOL 10 MG/ML IV BOLUS
INTRAVENOUS | Status: AC
  Filled 2021-06-24: qty 20

## 2021-06-24 MED ORDER — FENTANYL CITRATE (PF) 250 MCG/5ML IJ SOLN
INTRAMUSCULAR | Status: AC
  Filled 2021-06-24: qty 5

## 2021-06-24 MED ORDER — 0.9 % SODIUM CHLORIDE (POUR BTL) OPTIME
TOPICAL | Status: DC | PRN
  Administered 2021-06-24: 1000 mL

## 2021-06-24 MED ORDER — ROCURONIUM BROMIDE 10 MG/ML (PF) SYRINGE
PREFILLED_SYRINGE | INTRAVENOUS | Status: AC
  Filled 2021-06-24: qty 10

## 2021-06-24 MED ORDER — ACETAMINOPHEN 500 MG PO TABS: 1000.0000 mg | ORAL_TABLET | Freq: Once | ORAL | Status: AC

## 2021-06-24 MED ORDER — ORAL CARE MOUTH RINSE: 15.0000 mL | Freq: Once | OROMUCOSAL | Status: AC

## 2021-06-24 MED ORDER — LIDOCAINE 2% (20 MG/ML) 5 ML SYRINGE
INTRAMUSCULAR | Status: DC | PRN
  Administered 2021-06-24: 80 mg via INTRAVENOUS

## 2021-06-24 MED ORDER — POLYETHYLENE GLYCOL 3350 17 G PO PACK
17.0000 g | PACK | Freq: Every day | ORAL | Status: DC | PRN
  Administered 2021-06-27 – 2021-07-03 (×3): 17 g via ORAL
  Filled 2021-06-24 (×4): qty 1

## 2021-06-24 MED ORDER — OXYCODONE HCL 5 MG PO TABS: 5.0000 mg | ORAL_TABLET | Freq: Once | ORAL | Status: DC | PRN

## 2021-06-24 MED ORDER — HYDROMORPHONE HCL 1 MG/ML IJ SOLN
0.5000 mg | INTRAMUSCULAR | Status: DC | PRN
  Administered 2021-06-24 – 2021-06-25 (×4): 1 mg via INTRAVENOUS
  Filled 2021-06-24 (×4): qty 1

## 2021-06-24 MED ORDER — DEXAMETHASONE SODIUM PHOSPHATE 10 MG/ML IJ SOLN
INTRAMUSCULAR | Status: DC | PRN
  Administered 2021-06-24: 4 mg via INTRAVENOUS

## 2021-06-24 MED ORDER — SCOPOLAMINE 1 MG/3DAYS TD PT72
MEDICATED_PATCH | TRANSDERMAL | Status: AC
  Administered 2021-06-24: 1.5 mg via TRANSDERMAL
  Filled 2021-06-24: qty 1

## 2021-06-24 MED ORDER — MIDAZOLAM HCL 2 MG/2ML IJ SOLN
INTRAMUSCULAR | Status: DC | PRN
Start: 2021-06-24 — End: 2021-06-24
  Administered 2021-06-24: 2 mg via INTRAVENOUS

## 2021-06-24 MED ORDER — ONDANSETRON HCL 4 MG/2ML IJ SOLN
4.0000 mg | Freq: Four times a day (QID) | INTRAMUSCULAR | Status: DC | PRN
Start: 2021-06-24 — End: 2021-06-25

## 2021-06-24 MED ORDER — OXYCODONE HCL 5 MG/5ML PO SOLN: 5.0000 mg | Freq: Once | ORAL | Status: DC | PRN

## 2021-06-24 MED ORDER — OXYCODONE HCL 5 MG PO TABS
5.0000 mg | ORAL_TABLET | ORAL | Status: DC | PRN
  Administered 2021-06-24: 10 mg via ORAL
  Filled 2021-06-24: qty 2

## 2021-06-24 MED ORDER — ONDANSETRON HCL 4 MG/2ML IJ SOLN
INTRAMUSCULAR | Status: DC | PRN
  Administered 2021-06-24: 4 mg via INTRAVENOUS

## 2021-06-24 MED ORDER — DOCUSATE SODIUM 100 MG PO CAPS
100.0000 mg | ORAL_CAPSULE | Freq: Two times a day (BID) | ORAL | Status: DC
  Administered 2021-06-24 – 2021-07-12 (×36): 100 mg via ORAL
  Filled 2021-06-24 (×36): qty 1

## 2021-06-24 MED ORDER — SODIUM CHLORIDE 0.9 % IR SOLN
Status: DC | PRN
  Administered 2021-06-24 (×2): 3000 mL

## 2021-06-24 MED ORDER — ONDANSETRON HCL 4 MG PO TABS: 4.0000 mg | ORAL_TABLET | Freq: Four times a day (QID) | ORAL | Status: DC | PRN

## 2021-06-24 MED ORDER — ACETAMINOPHEN 500 MG PO TABS
ORAL_TABLET | ORAL | Status: AC
  Administered 2021-06-24: 1000 mg via ORAL
  Filled 2021-06-24: qty 2

## 2021-06-24 MED ORDER — SODIUM CHLORIDE 0.9 % IV SOLN
2.0000 g | INTRAVENOUS | Status: AC
  Administered 2021-06-24 – 2021-06-26 (×3): 2 g via INTRAVENOUS
  Filled 2021-06-24 (×4): qty 20

## 2021-06-24 MED ORDER — FENTANYL CITRATE (PF) 100 MCG/2ML IJ SOLN: 25.0000 ug | INTRAMUSCULAR | Status: DC | PRN

## 2021-06-24 MED ORDER — ROCURONIUM BROMIDE 10 MG/ML (PF) SYRINGE
PREFILLED_SYRINGE | INTRAVENOUS | Status: DC | PRN
Start: 2021-06-24 — End: 2021-06-24
  Administered 2021-06-24: 80 mg via INTRAVENOUS

## 2021-06-24 MED ORDER — LACTATED RINGERS IV SOLN: INTRAVENOUS | Status: DC

## 2021-06-24 MED ORDER — CHLORHEXIDINE GLUCONATE 0.12 % MT SOLN
15.0000 mL | Freq: Once | OROMUCOSAL | Status: AC
  Administered 2021-06-24: 15 mL via OROMUCOSAL
  Filled 2021-06-24: qty 15

## 2021-06-24 SURGICAL SUPPLY — 71 items
APL PRP STRL LF DISP 70% ISPRP (MISCELLANEOUS) ×1
BAG COUNTER SPONGE SURGICOUNT (BAG) ×2 IMPLANT
BAG SPNG CNTER NS LX DISP (BAG) ×1
BIT DRILL CALIBRATED 4.2 (BIT) IMPLANT
BIT DRILL SHORT 4.2 (BIT) IMPLANT
BLADE SURG 10 STRL SS (BLADE) ×4 IMPLANT
BNDG CMPR MED 10X6 ELC LF (GAUZE/BANDAGES/DRESSINGS) ×1
BNDG COHESIVE 4X5 TAN STRL (GAUZE/BANDAGES/DRESSINGS) ×2 IMPLANT
BNDG ELASTIC 4X5.8 VLCR STR LF (GAUZE/BANDAGES/DRESSINGS) ×2 IMPLANT
BNDG ELASTIC 6X10 VLCR STRL LF (GAUZE/BANDAGES/DRESSINGS) ×1 IMPLANT
BNDG ELASTIC 6X5.8 VLCR STR LF (GAUZE/BANDAGES/DRESSINGS) ×2 IMPLANT
BNDG GAUZE ELAST 4 BULKY (GAUZE/BANDAGES/DRESSINGS) ×4 IMPLANT
BRUSH SCRUB EZ PLAIN DRY (MISCELLANEOUS) ×4 IMPLANT
CANISTER WOUNDNEG PRESSURE 500 (CANNISTER) ×1 IMPLANT
CHLORAPREP W/TINT 26 (MISCELLANEOUS) ×2 IMPLANT
COVER SURGICAL LIGHT HANDLE (MISCELLANEOUS) ×4 IMPLANT
DRAPE C-ARM 42X72 X-RAY (DRAPES) ×2 IMPLANT
DRAPE C-ARMOR (DRAPES) ×2 IMPLANT
DRAPE HALF SHEET 40X57 (DRAPES) ×4 IMPLANT
DRAPE IMP U-DRAPE 54X76 (DRAPES) ×4 IMPLANT
DRAPE INCISE IOBAN 66X45 STRL (DRAPES) IMPLANT
DRAPE ORTHO SPLIT 77X108 STRL (DRAPES) ×4
DRAPE SURG ORHT 6 SPLT 77X108 (DRAPES) ×2 IMPLANT
DRAPE U-SHAPE 47X51 STRL (DRAPES) ×2 IMPLANT
DRESSING PEEL AND PLC PRVNA 13 (GAUZE/BANDAGES/DRESSINGS) IMPLANT
DRILL BIT CALIBRATED 4.2 (BIT) ×2
DRILL BIT SHORT 4.2 (BIT) ×2
DRSG ADAPTIC 3X8 NADH LF (GAUZE/BANDAGES/DRESSINGS) ×2 IMPLANT
DRSG MEPITEL 4X7.2 (GAUZE/BANDAGES/DRESSINGS) ×2 IMPLANT
DRSG PEEL AND PLACE PREVENA 13 (GAUZE/BANDAGES/DRESSINGS) ×2
ELECT REM PT RETURN 9FT ADLT (ELECTROSURGICAL) ×2
ELECTRODE REM PT RTRN 9FT ADLT (ELECTROSURGICAL) ×1 IMPLANT
GAUZE SPONGE 4X4 12PLY STRL (GAUZE/BANDAGES/DRESSINGS) ×3 IMPLANT
GLOVE SURG ENC MOIS LTX SZ6.5 (GLOVE) ×6 IMPLANT
GLOVE SURG ENC MOIS LTX SZ7.5 (GLOVE) ×8 IMPLANT
GLOVE SURG UNDER POLY LF SZ6.5 (GLOVE) ×2 IMPLANT
GLOVE SURG UNDER POLY LF SZ7.5 (GLOVE) ×2 IMPLANT
GOWN STRL REUS W/ TWL LRG LVL3 (GOWN DISPOSABLE) ×2 IMPLANT
GOWN STRL REUS W/TWL LRG LVL3 (GOWN DISPOSABLE) ×4
GUIDEWIRE 3.2X400 (WIRE) ×1 IMPLANT
KIT BASIN OR (CUSTOM PROCEDURE TRAY) ×2 IMPLANT
KIT TURNOVER KIT B (KITS) ×2 IMPLANT
MANIFOLD NEPTUNE II (INSTRUMENTS) ×2 IMPLANT
NAIL TIB TFNA 9X330 (Nail) ×1 IMPLANT
NS IRRIG 1000ML POUR BTL (IV SOLUTION) ×2 IMPLANT
PACK TOTAL JOINT (CUSTOM PROCEDURE TRAY) ×2 IMPLANT
PAD ARMBOARD 7.5X6 YLW CONV (MISCELLANEOUS) ×4 IMPLANT
PADDING CAST ABS 6INX4YD NS (CAST SUPPLIES) ×1
PADDING CAST ABS COTTON 6X4 NS (CAST SUPPLIES) IMPLANT
PADDING CAST COTTON 6X4 STRL (CAST SUPPLIES) ×4 IMPLANT
REAMER ROD DEEP FLUTE 2.5X950 (INSTRUMENTS) ×1 IMPLANT
SCREW LOCK IM NAIL 5.0X34 (Screw) ×1 IMPLANT
SCREW LOCK IM NAIL 5X66 (Screw) ×1 IMPLANT
SCREW LOCK IM NAIL 5X70 (Screw) ×1 IMPLANT
SCREW LOCK IM NL 5X38 (Screw) ×1 IMPLANT
SCREW LOCK IM TI 5X32 (Screw) ×2 IMPLANT
SPONGE T-LAP 18X18 ~~LOC~~+RFID (SPONGE) ×2 IMPLANT
STAPLER VISISTAT 35W (STAPLE) ×2 IMPLANT
SUT ETHILON 2 0 FS 18 (SUTURE) ×1 IMPLANT
SUT ETHILON 3 0 PS 1 (SUTURE) ×2 IMPLANT
SUT MNCRL AB 3-0 PS2 18 (SUTURE) ×2 IMPLANT
SUT MON AB 2-0 CT1 36 (SUTURE) ×2 IMPLANT
SUT VIC AB 0 CT1 27 (SUTURE)
SUT VIC AB 0 CT1 27XBRD ANBCTR (SUTURE) IMPLANT
SUT VIC AB 2-0 CT1 27 (SUTURE) ×4
SUT VIC AB 2-0 CT1 TAPERPNT 27 (SUTURE) IMPLANT
TOWEL GREEN STERILE (TOWEL DISPOSABLE) ×4 IMPLANT
TOWEL GREEN STERILE FF (TOWEL DISPOSABLE) ×4 IMPLANT
UNDERPAD 30X36 HEAVY ABSORB (UNDERPADS AND DIAPERS) ×2 IMPLANT
WATER STERILE IRR 1000ML POUR (IV SOLUTION) ×4 IMPLANT
YANKAUER SUCT BULB TIP NO VENT (SUCTIONS) IMPLANT

## 2021-06-24 NOTE — Interval H&P Note (Signed)
History and Physical Interval Note:  06/24/2021 9:06 AM  Cindy Valdez  has presented today for surgery, with the diagnosis of Right open tibia fracture.  The various methods of treatment have been discussed with the patient and family. After consideration of risks, benefits and other options for treatment, the patient has consented to  Procedure(s): INTRAMEDULLARY (IM) NAIL TIBIAL (Right) REMOVAL EXTERNAL FIXATION LEG (Right) as a surgical intervention.  The patient's history has been reviewed, patient examined, no change in status, stable for surgery.  I have reviewed the patient's chart and labs.  Questions were answered to the patient's satisfaction.     Caryn Bee P Azaliah Carrero

## 2021-06-24 NOTE — Transfer of Care (Signed)
Immediate Anesthesia Transfer of Care Note  Patient: Cindy Valdez  Procedure(s) Performed: INTRAMEDULLARY (IM) NAIL TIBIAL (Right: Leg Lower) REMOVAL EXTERNAL FIXATION LEG (Right: Leg Lower)  Patient Location: PACU  Anesthesia Type:General  Level of Consciousness: drowsy and patient cooperative  Airway & Oxygen Therapy: Patient Spontanous Breathing and Patient connected to nasal cannula oxygen  Post-op Assessment: Report given to RN, Post -op Vital signs reviewed and stable and Patient moving all extremities  Post vital signs: Reviewed and stable  Last Vitals:  Vitals Value Taken Time  BP 136/64 06/24/21 1202  Temp    Pulse 103 06/24/21 1205  Resp 8 06/24/21 1205  SpO2 86 % 06/24/21 1205  Vitals shown include unvalidated device data.  Last Pain:  Vitals:   06/24/21 0930  TempSrc:   PainSc: 0-No pain      Patients Stated Pain Goal: 2 (06/24/21 0221)  Complications: No notable events documented.

## 2021-06-24 NOTE — Anesthesia Procedure Notes (Signed)
Procedure Name: Intubation Date/Time: 06/24/2021 9:47 AM Performed by: Moshe Salisbury, CRNA Pre-anesthesia Checklist: Patient identified, Emergency Drugs available, Suction available and Patient being monitored Patient Re-evaluated:Patient Re-evaluated prior to induction Oxygen Delivery Method: Circle System Utilized Preoxygenation: Pre-oxygenation with 100% oxygen Induction Type: IV induction Ventilation: Mask ventilation without difficulty Laryngoscope Size: Mac and 3 Grade View: Grade I Tube type: Oral Tube size: 7.5 mm Number of attempts: 1 Airway Equipment and Method: Stylet Placement Confirmation: ETT inserted through vocal cords under direct vision, positive ETCO2 and breath sounds checked- equal and bilateral Secured at: 21 cm Tube secured with: Tape Dental Injury: Teeth and Oropharynx as per pre-operative assessment
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
## 2021-06-24 NOTE — Progress Notes (Signed)
Patient ID: Cindy Valdez, female   DOB: May 15, 1966, 55 y.o.   MRN: [id number] The patient is awake and alert this evening.  She tolerated surgery earlier today on her right tibia by Dr. Jena Gauss.  He was able to remove the external fixator and place an intramedullary nail in the tibia fortunately.  She tolerated that very well.  I talked her in length in detail about her left hip fracture.  I have her set up tomorrow for surgery for a direct anterior hip replacement to address this injury.  I did discuss the risk and benefits of surgery and what to expect.  We will have her n.p.o. after midnight tonight.  We will check a CBC first thing in the morning as well tomorrow.  All questions and concerns were answered and addressed.
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
## 2021-06-24 NOTE — Anesthesia Preprocedure Evaluation (Addendum)
Anesthesia Evaluation  Patient identified by MRN, date of birth, ID band Patient awake    Reviewed: Allergy & Precautions, H&P , NPO status , Patient's Chart, lab work & pertinent test results  Airway Mallampati: II   Neck ROM: full    Dental no notable dental hx.    Pulmonary neg pulmonary ROS,    breath sounds clear to auscultation       Cardiovascular Exercise Tolerance: Good negative cardio ROS   Rhythm:regular Rate:Normal     Neuro/Psych negative neurological ROS  negative psych ROS   GI/Hepatic negative GI ROS, Neg liver ROS,   Endo/Other  Morbid obesity  Renal/GU negative Renal ROS  negative genitourinary   Musculoskeletal negative musculoskeletal ROS (+)   Abdominal   Peds negative pediatric ROS (+)  Hematology negative hematology ROS (+)   Anesthesia Other Findings Open tib/fib; femur fx   Reproductive/Obstetrics negative OB ROS                             Anesthesia Physical  Anesthesia Plan  ASA: 2  Anesthesia Plan: General   Post-op Pain Management:    Induction: Intravenous  PONV Risk Score and Plan: 3 and Ondansetron, Dexamethasone, Midazolam, Treatment may vary due to age or medical condition and Scopolamine patch - Pre-op  Airway Management Planned: Oral ETT  Additional Equipment: None  Intra-op Plan:   Post-operative Plan: Extubation in OR  Informed Consent: I have reviewed the patients History and Physical, chart, labs and discussed the procedure including the risks, benefits and alternatives for the proposed anesthesia with the patient or authorized representative who has indicated his/her understanding and acceptance.     Dental advisory given  Plan Discussed with: CRNA, Anesthesiologist and Surgeon  Anesthesia Plan Comments:        Anesthesia Quick Evaluation
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
## 2021-06-25 ENCOUNTER — Inpatient Hospital Stay (HOSPITAL_COMMUNITY): Payer: BC Managed Care – PPO | Admitting: Anesthesiology

## 2021-06-25 ENCOUNTER — Encounter (HOSPITAL_COMMUNITY)
Admission: EM | Disposition: A | Discharge status: Rehabilitation Facility | Payer: Self-pay | Source: Home / Self Care | Attending: Orthopaedic Surgery

## 2021-06-25 ENCOUNTER — Inpatient Hospital Stay (HOSPITAL_COMMUNITY): Payer: BC Managed Care – PPO

## 2021-06-25 ENCOUNTER — Encounter (HOSPITAL_COMMUNITY): Payer: Self-pay | Admitting: Orthopaedic Surgery

## 2021-06-25 DIAGNOSIS — I82441 Acute embolism and thrombosis of right tibial vein: Secondary | ICD-10-CM | POA: Diagnosis present

## 2021-06-25 DIAGNOSIS — Y92488 Other paved roadways as the place of occurrence of the external cause: Secondary | ICD-10-CM | POA: Diagnosis not present

## 2021-06-25 DIAGNOSIS — Z96642 Presence of left artificial hip joint: Secondary | ICD-10-CM | POA: Diagnosis not present

## 2021-06-25 DIAGNOSIS — S82451C Displaced comminuted fracture of shaft of right fibula, initial encounter for open fracture type IIIA, IIIB, or IIIC: Secondary | ICD-10-CM | POA: Diagnosis present

## 2021-06-25 DIAGNOSIS — K5903 Drug induced constipation: Secondary | ICD-10-CM | POA: Diagnosis present

## 2021-06-25 DIAGNOSIS — Z20822 Contact with and (suspected) exposure to covid-19: Secondary | ICD-10-CM | POA: Diagnosis present

## 2021-06-25 DIAGNOSIS — T148XXA Other injury of unspecified body region, initial encounter: Secondary | ICD-10-CM | POA: Diagnosis not present

## 2021-06-25 DIAGNOSIS — G8918 Other acute postprocedural pain: Secondary | ICD-10-CM | POA: Diagnosis not present

## 2021-06-25 DIAGNOSIS — G4733 Obstructive sleep apnea (adult) (pediatric): Secondary | ICD-10-CM | POA: Diagnosis present

## 2021-06-25 DIAGNOSIS — S72002A Fracture of unspecified part of neck of left femur, initial encounter for closed fracture: Principal | ICD-10-CM

## 2021-06-25 DIAGNOSIS — S82251C Displaced comminuted fracture of shaft of right tibia, initial encounter for open fracture type IIIA, IIIB, or IIIC: Secondary | ICD-10-CM | POA: Diagnosis present

## 2021-06-25 DIAGNOSIS — Z87891 Personal history of nicotine dependence: Secondary | ICD-10-CM | POA: Diagnosis not present

## 2021-06-25 DIAGNOSIS — Z6841 Body Mass Index (BMI) 40.0 and over, adult: Secondary | ICD-10-CM | POA: Diagnosis not present

## 2021-06-25 DIAGNOSIS — T1490XA Injury, unspecified, initial encounter: Secondary | ICD-10-CM | POA: Diagnosis present

## 2021-06-25 DIAGNOSIS — K219 Gastro-esophageal reflux disease without esophagitis: Secondary | ICD-10-CM | POA: Diagnosis present

## 2021-06-25 DIAGNOSIS — Z23 Encounter for immunization: Secondary | ICD-10-CM | POA: Diagnosis not present

## 2021-06-25 DIAGNOSIS — D62 Acute posthemorrhagic anemia: Secondary | ICD-10-CM | POA: Diagnosis not present

## 2021-06-25 HISTORY — PX: TOTAL HIP ARTHROPLASTY: SHX124

## 2021-06-25 LAB — BASIC METABOLIC PANEL
Anion gap: 6 (ref 5–15)
BUN: 10 mg/dL (ref 6–20)
CO2: 25 mmol/L (ref 22–32)
Calcium: 7.6 mg/dL — ABNORMAL LOW (ref 8.9–10.3)
Chloride: 107 mmol/L (ref 98–111)
Creatinine, Ser: 0.86 mg/dL (ref 0.44–1.00)
GFR, Estimated: >60 mL/min (ref >60)
Glucose, Bld: 129 mg/dL — ABNORMAL HIGH (ref 70–99)
Potassium: 4.3 mmol/L (ref 3.5–5.1)
Sodium: 138 mmol/L (ref 135–145)

## 2021-06-25 LAB — CBC
HCT: 25.2 % — ABNORMAL LOW (ref 36.0–46.0)
Hemoglobin: 8.5 g/dL — ABNORMAL LOW (ref 12.0–15.0)
MCH: 28.8 pg (ref 26.0–34.0)
MCHC: 33.7 g/dL (ref 30.0–36.0)
MCV: 85.4 fL (ref 80.0–100.0)
Platelets: 206 10*3/uL (ref 150–400)
RBC: 2.95 MIL/uL — ABNORMAL LOW (ref 3.87–5.11)
RDW: 13.8 % (ref 11.5–15.5)
WBC: 12.3 10*3/uL — ABNORMAL HIGH (ref 4.0–10.5)
nRBC: 0.2 % (ref 0.0–0.2)

## 2021-06-25 LAB — POCT I-STAT, CHEM 8
BUN: 11 mg/dL (ref 6–20)
Calcium, Ion: 1.13 mmol/L — ABNORMAL LOW (ref 1.15–1.40)
Chloride: 105 mmol/L (ref 98–111)
Creatinine, Ser: 0.7 mg/dL (ref 0.44–1.00)
Glucose, Bld: 137 mg/dL — ABNORMAL HIGH (ref 70–99)
HCT: 21 % — ABNORMAL LOW (ref 36.0–46.0)
Hemoglobin: 7.1 g/dL — ABNORMAL LOW (ref 12.0–15.0)
Potassium: 3.6 mmol/L (ref 3.5–5.1)
Sodium: 140 mmol/L (ref 135–145)
TCO2: 24 mmol/L (ref 22–32)

## 2021-06-25 LAB — PREPARE RBC (CROSSMATCH)

## 2021-06-25 LAB — SAMPLE TO BLOOD BANK

## 2021-06-25 LAB — ABO/RH: ABO/RH(D): B POS

## 2021-06-25 SURGERY — ARTHROPLASTY, HIP, TOTAL, ANTERIOR APPROACH
Anesthesia: General | Site: Hip | Laterality: Left

## 2021-06-25 MED ORDER — ORAL CARE MOUTH RINSE: 15.0000 mL | Freq: Once | OROMUCOSAL | Status: AC

## 2021-06-25 MED ORDER — MIDAZOLAM HCL 2 MG/2ML IJ SOLN
INTRAMUSCULAR | Status: AC
  Filled 2021-06-25: qty 2

## 2021-06-25 MED ORDER — ONDANSETRON HCL 4 MG/2ML IJ SOLN: 4.0000 mg | Freq: Four times a day (QID) | INTRAMUSCULAR | Status: DC | PRN

## 2021-06-25 MED ORDER — FENTANYL CITRATE (PF) 250 MCG/5ML IJ SOLN
INTRAMUSCULAR | Status: AC
  Filled 2021-06-25: qty 5

## 2021-06-25 MED ORDER — FENTANYL CITRATE (PF) 100 MCG/2ML IJ SOLN: 100.0000 ug | Freq: Once | INTRAMUSCULAR | Status: AC

## 2021-06-25 MED ORDER — SODIUM CHLORIDE 0.9% IV SOLUTION: Freq: Once | INTRAVENOUS | Status: AC

## 2021-06-25 MED ORDER — ACETAMINOPHEN 500 MG PO TABS
1000.0000 mg | ORAL_TABLET | Freq: Once | ORAL | Status: AC
  Administered 2021-06-25: 1000 mg via ORAL

## 2021-06-25 MED ORDER — ACETAMINOPHEN 10 MG/ML IV SOLN: 1000.0000 mg | Freq: Once | INTRAVENOUS | Status: DC | PRN

## 2021-06-25 MED ORDER — CHLORHEXIDINE GLUCONATE 0.12 % MT SOLN: 15.0000 mL | Freq: Once | OROMUCOSAL | Status: AC

## 2021-06-25 MED ORDER — METOCLOPRAMIDE HCL 5 MG PO TABS: 5.0000 mg | ORAL_TABLET | Freq: Three times a day (TID) | ORAL | Status: DC | PRN

## 2021-06-25 MED ORDER — DEXTROSE 5 % IV SOLN
INTRAVENOUS | Status: DC | PRN
  Administered 2021-06-25: 3 g via INTRAVENOUS

## 2021-06-25 MED ORDER — HYDROMORPHONE HCL 1 MG/ML IJ SOLN
0.5000 mg | INTRAMUSCULAR | Status: DC | PRN
  Administered 2021-06-25 – 2021-07-01 (×14): 1 mg via INTRAVENOUS
  Filled 2021-06-25 (×15): qty 1

## 2021-06-25 MED ORDER — PHENOL 1.4 % MT LIQD: 1.0000 | OROMUCOSAL | Status: DC | PRN

## 2021-06-25 MED ORDER — ACETAMINOPHEN 325 MG PO TABS: 325.0000 mg | ORAL_TABLET | Freq: Four times a day (QID) | ORAL | Status: DC | PRN

## 2021-06-25 MED ORDER — METOCLOPRAMIDE HCL 5 MG/ML IJ SOLN: 5.0000 mg | Freq: Three times a day (TID) | INTRAMUSCULAR | Status: DC | PRN

## 2021-06-25 MED ORDER — SODIUM CHLORIDE 0.9 % IV SOLN: INTRAVENOUS | Status: DC

## 2021-06-25 MED ORDER — LACTATED RINGERS IV SOLN: INTRAVENOUS | Status: DC | PRN

## 2021-06-25 MED ORDER — PROPOFOL 10 MG/ML IV BOLUS
INTRAVENOUS | Status: AC
  Filled 2021-06-25: qty 40

## 2021-06-25 MED ORDER — CHLORHEXIDINE GLUCONATE 0.12 % MT SOLN
OROMUCOSAL | Status: AC
  Administered 2021-06-25: 15 mL via OROMUCOSAL
  Filled 2021-06-25: qty 15

## 2021-06-25 MED ORDER — 0.9 % SODIUM CHLORIDE (POUR BTL) OPTIME
TOPICAL | Status: DC | PRN
  Administered 2021-06-25: 1000 mL

## 2021-06-25 MED ORDER — METHOCARBAMOL 1000 MG/10ML IJ SOLN
500.0000 mg | Freq: Four times a day (QID) | INTRAVENOUS | Status: DC | PRN
  Filled 2021-06-25: qty 5

## 2021-06-25 MED ORDER — LIDOCAINE 2% (20 MG/ML) 5 ML SYRINGE
INTRAMUSCULAR | Status: DC | PRN
  Administered 2021-06-25: 80 mg via INTRAVENOUS

## 2021-06-25 MED ORDER — SUGAMMADEX SODIUM 200 MG/2ML IV SOLN
INTRAVENOUS | Status: DC | PRN
  Administered 2021-06-25: 400 mg via INTRAVENOUS

## 2021-06-25 MED ORDER — FENTANYL CITRATE (PF) 100 MCG/2ML IJ SOLN
25.0000 ug | INTRAMUSCULAR | Status: DC | PRN
  Administered 2021-06-25 (×4): 50 ug via INTRAVENOUS

## 2021-06-25 MED ORDER — OXYCODONE HCL 5 MG/5ML PO SOLN: 5.0000 mg | Freq: Once | ORAL | Status: DC | PRN

## 2021-06-25 MED ORDER — ONDANSETRON HCL 4 MG/2ML IJ SOLN
INTRAMUSCULAR | Status: DC | PRN
  Administered 2021-06-25: 4 mg via INTRAVENOUS

## 2021-06-25 MED ORDER — ACETAMINOPHEN 160 MG/5ML PO SOLN: 1000.0000 mg | Freq: Once | ORAL | Status: DC | PRN

## 2021-06-25 MED ORDER — ONDANSETRON HCL 4 MG PO TABS: 4.0000 mg | ORAL_TABLET | Freq: Four times a day (QID) | ORAL | Status: DC | PRN

## 2021-06-25 MED ORDER — APIXABAN 2.5 MG PO TABS
2.5000 mg | ORAL_TABLET | Freq: Two times a day (BID) | ORAL | Status: DC
  Administered 2021-06-26 – 2021-07-03 (×15): 2.5 mg via ORAL
  Filled 2021-06-25 (×17): qty 1

## 2021-06-25 MED ORDER — ALUM & MAG HYDROXIDE-SIMETH 200-200-20 MG/5ML PO SUSP: 30.0000 mL | ORAL | Status: DC | PRN

## 2021-06-25 MED ORDER — DIPHENHYDRAMINE HCL 12.5 MG/5ML PO ELIX: 12.5000 mg | ORAL_SOLUTION | ORAL | Status: DC | PRN

## 2021-06-25 MED ORDER — PANTOPRAZOLE SODIUM 40 MG PO TBEC: 40.0000 mg | DELAYED_RELEASE_TABLET | Freq: Every day | ORAL | Status: DC

## 2021-06-25 MED ORDER — SODIUM CHLORIDE 0.9 % IR SOLN
Status: DC | PRN
  Administered 2021-06-25: 1000 mL

## 2021-06-25 MED ORDER — OXYCODONE HCL 5 MG PO TABS
10.0000 mg | ORAL_TABLET | ORAL | Status: DC | PRN
  Administered 2021-06-25 – 2021-06-26 (×3): 10 mg via ORAL
  Administered 2021-06-27 – 2021-07-11 (×23): 15 mg via ORAL
  Filled 2021-06-25 (×5): qty 3
  Filled 2021-06-25: qty 2
  Filled 2021-06-25 (×9): qty 3
  Filled 2021-06-25: qty 2
  Filled 2021-06-25 (×3): qty 3
  Filled 2021-06-25: qty 2
  Filled 2021-06-25 (×7): qty 3

## 2021-06-25 MED ORDER — FENTANYL CITRATE (PF) 250 MCG/5ML IJ SOLN
INTRAMUSCULAR | Status: DC | PRN
  Administered 2021-06-25 (×3): 100 ug via INTRAVENOUS
  Administered 2021-06-25: 50 ug via INTRAVENOUS
  Administered 2021-06-25: 100 ug via INTRAVENOUS

## 2021-06-25 MED ORDER — OXYCODONE HCL 5 MG PO TABS: 5.0000 mg | ORAL_TABLET | Freq: Once | ORAL | Status: DC | PRN

## 2021-06-25 MED ORDER — ACETAMINOPHEN 500 MG PO TABS: 1000.0000 mg | ORAL_TABLET | Freq: Once | ORAL | Status: DC | PRN

## 2021-06-25 MED ORDER — MIDAZOLAM HCL 2 MG/2ML IJ SOLN
INTRAMUSCULAR | Status: DC | PRN
  Administered 2021-06-25: 2 mg via INTRAVENOUS

## 2021-06-25 MED ORDER — PROPOFOL 10 MG/ML IV BOLUS
INTRAVENOUS | Status: DC | PRN
  Administered 2021-06-25: 120 mg via INTRAVENOUS

## 2021-06-25 MED ORDER — ROCURONIUM BROMIDE 10 MG/ML (PF) SYRINGE
PREFILLED_SYRINGE | INTRAVENOUS | Status: DC | PRN
Start: 2021-06-25 — End: 2021-06-25
  Administered 2021-06-25: 80 mg via INTRAVENOUS

## 2021-06-25 MED ORDER — ACETAMINOPHEN 10 MG/ML IV SOLN
INTRAVENOUS | Status: AC
  Filled 2021-06-25: qty 100

## 2021-06-25 MED ORDER — MENTHOL 3 MG MT LOZG: 1.0000 | LOZENGE | OROMUCOSAL | Status: DC | PRN

## 2021-06-25 MED ORDER — ALBUMIN HUMAN 5 % IV SOLN: INTRAVENOUS | Status: DC | PRN

## 2021-06-25 MED ORDER — CEFAZOLIN SODIUM-DEXTROSE 2-4 GM/100ML-% IV SOLN
2.0000 g | Freq: Four times a day (QID) | INTRAVENOUS | Status: DC
  Filled 2021-06-25 (×2): qty 100

## 2021-06-25 MED ORDER — FENTANYL CITRATE (PF) 100 MCG/2ML IJ SOLN
INTRAMUSCULAR | Status: AC
  Filled 2021-06-25: qty 2

## 2021-06-25 MED ORDER — DOCUSATE SODIUM 100 MG PO CAPS: 100.0000 mg | ORAL_CAPSULE | Freq: Two times a day (BID) | ORAL | Status: DC

## 2021-06-25 MED ORDER — OXYCODONE HCL 5 MG PO TABS
5.0000 mg | ORAL_TABLET | ORAL | Status: DC | PRN
Start: 2021-06-25 — End: 2021-07-12
  Administered 2021-06-26: 5 mg via ORAL
  Administered 2021-06-30 – 2021-07-10 (×17): 10 mg via ORAL
  Filled 2021-06-25 (×3): qty 2
  Filled 2021-06-25: qty 1
  Filled 2021-06-25 (×7): qty 2
  Filled 2021-06-25: qty 1
  Filled 2021-06-25 (×7): qty 2

## 2021-06-25 MED ORDER — SODIUM CHLORIDE 0.9 % IV SOLN: INTRAVENOUS | Status: DC | PRN

## 2021-06-25 MED ORDER — METHOCARBAMOL 500 MG PO TABS
500.0000 mg | ORAL_TABLET | Freq: Four times a day (QID) | ORAL | Status: DC | PRN
  Administered 2021-06-25 – 2021-07-09 (×11): 500 mg via ORAL
  Filled 2021-06-25 (×12): qty 1

## 2021-06-25 MED ORDER — LACTATED RINGERS IV SOLN: INTRAVENOUS | Status: DC

## 2021-06-25 SURGICAL SUPPLY — 59 items
APL SKNCLS STERI-STRIP NONHPOA (GAUZE/BANDAGES/DRESSINGS)
BAG COUNTER SPONGE SURGICOUNT (BAG) ×4 IMPLANT
BAG SPNG CNTER NS LX DISP (BAG) ×2
BALL HIP CERAMIC (Hips) ×1 IMPLANT
BENZOIN TINCTURE PRP APPL 2/3 (GAUZE/BANDAGES/DRESSINGS) IMPLANT
BLADE CLIPPER SURG (BLADE) IMPLANT
BLADE SAW SGTL 18X1.27X75 (BLADE) ×2 IMPLANT
COVER SURGICAL LIGHT HANDLE (MISCELLANEOUS) IMPLANT
CUP SECTOR GRIPTON 50MM (Cup) ×2 IMPLANT
DRAPE C-ARM 42X72 X-RAY (DRAPES) ×2 IMPLANT
DRAPE STERI IOBAN 125X83 (DRAPES) ×2 IMPLANT
DRAPE U-SHAPE 47X51 STRL (DRAPES) ×6 IMPLANT
DRSG AQUACEL AG ADV 3.5X10 (GAUZE/BANDAGES/DRESSINGS) ×2 IMPLANT
DRSG XEROFORM 1X8 (GAUZE/BANDAGES/DRESSINGS) ×2 IMPLANT
DURAPREP 26ML APPLICATOR (WOUND CARE) ×2 IMPLANT
ELECT BLADE 4.0 EZ CLEAN MEGAD (MISCELLANEOUS) ×2
ELECT BLADE 6.5 EXT (BLADE) ×2 IMPLANT
ELECT REM PT RETURN 9FT ADLT (ELECTROSURGICAL) ×2
ELECTRODE BLDE 4.0 EZ CLN MEGD (MISCELLANEOUS) ×1 IMPLANT
ELECTRODE REM PT RTRN 9FT ADLT (ELECTROSURGICAL) ×1 IMPLANT
FACESHIELD WRAPAROUND (MASK) ×4 IMPLANT
GLOVE SRG 8 PF TXTR STRL LF DI (GLOVE) ×2 IMPLANT
GLOVE SURG LTX SZ8 (GLOVE) ×2 IMPLANT
GLOVE SURG ORTHO LTX SZ7.5 (GLOVE) ×4 IMPLANT
GLOVE SURG UNDER POLY LF SZ8 (GLOVE) ×4
GOWN STRL REUS W/ TWL LRG LVL3 (GOWN DISPOSABLE) ×2 IMPLANT
GOWN STRL REUS W/ TWL XL LVL3 (GOWN DISPOSABLE) ×4 IMPLANT
GOWN STRL REUS W/TWL LRG LVL3 (GOWN DISPOSABLE) ×4
GOWN STRL REUS W/TWL XL LVL3 (GOWN DISPOSABLE) ×8
HANDPIECE INTERPULSE COAX TIP (DISPOSABLE) ×2
HIP BALL CERAMIC (Hips) ×2 IMPLANT
KIT BASIN OR (CUSTOM PROCEDURE TRAY) ×2 IMPLANT
KIT TURNOVER KIT B (KITS) ×2 IMPLANT
LINER ACET PNNCL PLUS4 NEUTRAL (Hips) ×1 IMPLANT
MANIFOLD NEPTUNE II (INSTRUMENTS) ×2 IMPLANT
NS IRRIG 1000ML POUR BTL (IV SOLUTION) ×2 IMPLANT
PACK TOTAL JOINT (CUSTOM PROCEDURE TRAY) ×2 IMPLANT
PAD ARMBOARD 7.5X6 YLW CONV (MISCELLANEOUS) ×4 IMPLANT
PINNACLE PLUS 4 NEUTRAL (Hips) ×2 IMPLANT
SCREW PINN CAN 6.5X20 (Screw) ×2 IMPLANT
SET HNDPC FAN SPRY TIP SCT (DISPOSABLE) ×1 IMPLANT
STAPLER VISISTAT 35W (STAPLE) IMPLANT
STEM FEM ACTIS HIGH SZ3 (Stem) ×2 IMPLANT
STRIP CLOSURE SKIN 1/2X4 (GAUZE/BANDAGES/DRESSINGS) IMPLANT
SUT ETHIBOND NAB CT1 #1 30IN (SUTURE) ×4 IMPLANT
SUT ETHILON 2 0 FS 18 (SUTURE) ×6 IMPLANT
SUT MNCRL AB 4-0 PS2 18 (SUTURE) IMPLANT
SUT VIC AB 0 CT1 27 (SUTURE) ×2
SUT VIC AB 0 CT1 27XBRD ANBCTR (SUTURE) ×1 IMPLANT
SUT VIC AB 1 CT1 27 (SUTURE) ×2
SUT VIC AB 1 CT1 27XBRD ANBCTR (SUTURE) ×1 IMPLANT
SUT VIC AB 2-0 CT1 27 (SUTURE) ×4
SUT VIC AB 2-0 CT1 TAPERPNT 27 (SUTURE) ×2 IMPLANT
TOWEL GREEN STERILE (TOWEL DISPOSABLE) ×2 IMPLANT
TOWEL GREEN STERILE FF (TOWEL DISPOSABLE) ×2 IMPLANT
TRAY CATH 16FR W/PLASTIC CATH (SET/KITS/TRAYS/PACK) IMPLANT
TRAY FOLEY W/BAG SLVR 16FR (SET/KITS/TRAYS/PACK)
TRAY FOLEY W/BAG SLVR 16FR ST (SET/KITS/TRAYS/PACK) IMPLANT
WATER STERILE IRR 1000ML POUR (IV SOLUTION) ×4 IMPLANT

## 2021-06-25 NOTE — Anesthesia Postprocedure Evaluation (Signed)
Anesthesia Post Note  Patient: Cindy Valdez  Procedure(s) Performed: INTRAMEDULLARY (IM) NAIL TIBIAL (Right: Leg Lower) REMOVAL EXTERNAL FIXATION LEG (Right: Leg Lower)     Patient location during evaluation: PACU Anesthesia Type: General Level of consciousness: awake and alert Pain management: pain level controlled Vital Signs Assessment: post-procedure vital signs reviewed and stable Respiratory status: spontaneous breathing and respiratory function stable Cardiovascular status: stable Postop Assessment: no apparent nausea or vomiting Anesthetic complications: no   No notable events documented.  Last Vitals:  Vitals:   06/24/21 2149 06/25/21 0440  BP: (!) 151/71 (!) 152/73  Pulse: 84 83  Resp: 18 18  Temp:  37 C  SpO2: 96% 100%    Last Pain:  Vitals:   06/25/21 0440  TempSrc: Oral  PainSc:                  Mellody Dance

## 2021-06-25 NOTE — Anesthesia Postprocedure Evaluation (Signed)
Anesthesia Post Note  Patient: Cindy Valdez  Procedure(s) Performed: IRRIGATION AND DEBRIDEMENT EXTREMITY (Bilateral: Leg Lower) EXTERNAL FIXATION LEG (Right: Leg Lower)     Patient location during evaluation: PACU Anesthesia Type: General Level of consciousness: awake and alert Pain management: pain level controlled Vital Signs Assessment: post-procedure vital signs reviewed and stable Respiratory status: spontaneous breathing, nonlabored ventilation, respiratory function stable and patient connected to nasal cannula oxygen Cardiovascular status: blood pressure returned to baseline and stable Postop Assessment: no apparent nausea or vomiting Anesthetic complications: no   No notable events documented.  Last Vitals:  Vitals:   06/25/21 0440 06/25/21 0731  BP: (!) 152/73 (!) 153/73  Pulse: 83 84  Resp: 18 18  Temp: 37 C 37.1 C  SpO2: 100% 100%    Last Pain:  Vitals:   06/25/21 0731  TempSrc: Oral  PainSc:                  Vinetta Brach S

## 2021-06-25 NOTE — Progress Notes (Signed)
PT Cancellation Note  Patient Details Name: Cindy Valdez MRN: [id number] DOB: 04/11/1966   Cancelled Treatment:    Reason Eval/Treat Not Completed: Patient at procedure or test/unavailable At 1650, pt still off floor for THA.  Will f/u at later date. Anise Salvo, PT Acute Rehab Services Pager [phone] [facility] [phone]   Rayetta Humphrey 06/25/2021, 4:50 PM

## 2021-06-25 NOTE — Anesthesia Procedure Notes (Signed)
Procedure Name: Intubation Date/Time: 06/25/2021 1:34 PM Performed by: Lelon Perla, CRNA Pre-anesthesia Checklist: Patient identified, Emergency Drugs available, Suction available and Patient being monitored Patient Re-evaluated:Patient Re-evaluated prior to induction Oxygen Delivery Method: Circle system utilized Preoxygenation: Pre-oxygenation with 100% oxygen Induction Type: IV induction Ventilation: Mask ventilation without difficulty Laryngoscope Size: Glidescope and 3 Grade View: Grade I Tube type: Oral Tube size: 7.0 mm Number of attempts: 1 Airway Equipment and Method: Stylet and Oral airway Placement Confirmation: ETT inserted through vocal cords under direct vision, positive ETCO2 and breath sounds checked- equal and bilateral Secured at: 21 cm Tube secured with: Tape Dental Injury: Teeth and Oropharynx as per pre-operative assessment
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
## 2021-06-25 NOTE — Progress Notes (Signed)
Inpatient Rehab Admissions Coordinator Note:   Per therapy recommendations patient was screened for CIR candidacy by Stephania Fragmin, PT. At this time, pt appears to be a potential candidate for CIR. I will request an order for rehab consult for full assessment, per our protocol.  Please contact me any with questions.Estill Dooms, PT, DPT [phone] 06/25/21 10:25 AM
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
## 2021-06-25 NOTE — Anesthesia Preprocedure Evaluation (Signed)
Anesthesia Evaluation  Patient identified by MRN, date of birth, ID band Patient awake    Reviewed: Allergy & Precautions, H&P , NPO status , Patient's Chart, lab work & pertinent test results  History of Anesthesia Complications Negative for: history of anesthetic complications  Airway Mallampati: III  TM Distance: >3 FB Neck ROM: full    Dental   Pulmonary sleep apnea , former smoker,    breath sounds clear to auscultation       Cardiovascular Exercise Tolerance: Good negative cardio ROS   Rhythm:regular Rate:Normal     Neuro/Psych  Headaches, negative psych ROS   GI/Hepatic Neg liver ROS, GERD  ,  Endo/Other  Morbid obesity  Renal/GU negative Renal ROS     Musculoskeletal negative musculoskeletal ROS (+)   Abdominal   Peds negative pediatric ROS (+)  Hematology  (+) anemia , Lab Results      Component                Value               Date                      WBC                      12.3 (H)            06/25/2021                HGB                      8.5 (L)             06/25/2021                HCT                      25.2 (L)            06/25/2021                MCV                      85.4                06/25/2021                PLT                      206                 06/25/2021              Anesthesia Other Findings Open tib/fib; femur fx   Reproductive/Obstetrics                             Anesthesia Physical Anesthesia Plan  ASA: 3  Anesthesia Plan: General   Post-op Pain Management:    Induction: Intravenous  PONV Risk Score and Plan: 3 and Ondansetron and Dexamethasone  Airway Management Planned: Oral ETT  Additional Equipment: None  Intra-op Plan:   Post-operative Plan: Extubation in OR  Informed Consent: I have reviewed the patients History and Physical, chart, labs and discussed the procedure including the risks, benefits and alternatives for  the proposed anesthesia with the patient or authorized representative who has indicated his/her understanding and acceptance.  Dental advisory given  Plan Discussed with: CRNA and Anesthesiologist  Anesthesia Plan Comments:         Anesthesia Quick Evaluation

## 2021-06-25 NOTE — Transfer of Care (Signed)
Immediate Anesthesia Transfer of Care Note  Patient: Cindy Valdez  Procedure(s) Performed: LEFT TOTAL HIP ARTHROPLASTY ANTERIOR APPROACH (Left: Hip)  Patient Location: PACU  Anesthesia Type:General  Level of Consciousness: awake, alert  and oriented  Airway & Oxygen Therapy: Patient Spontanous Breathing and Patient connected to nasal cannula oxygen  Post-op Assessment: Report given to RN and Post -op Vital signs reviewed and stable  Post vital signs: Reviewed and stable  Last Vitals:  Vitals Value Taken Time  BP 142/66 06/25/21 1533  Temp 37.2 C 06/25/21 1530  Pulse 85 06/25/21 1540  Resp 13 06/25/21 1540  SpO2 100 % 06/25/21 1540  Vitals shown include unvalidated device data.  Last Pain:  Vitals:   06/25/21 1300  TempSrc:   PainSc: 2       Patients Stated Pain Goal: 3 (06/25/21 1300)  Complications: No notable events documented.

## 2021-06-25 NOTE — Brief Op Note (Signed)
06/22/2021 - 06/25/2021  3:06 PM  PATIENT:  Cindy Valdez  55 y.o. female  PRE-OPERATIVE DIAGNOSIS:  left hip femoral neck fracture  POST-OPERATIVE DIAGNOSIS:  left hip femoral neck fracture  PROCEDURE:  Procedure(s): LEFT TOTAL HIP ARTHROPLASTY ANTERIOR APPROACH (Left)  SURGEON:  Surgeon(s) and Role:    Kathryne Hitch, MD - Primary  PHYSICIAN ASSISTANT:  Rexene Edison, PA-C  ANESTHESIA:   general  EBL:  500 mL   COUNTS:  YES  DICTATION: .Other Dictation: Dictation Number [id number]  PLAN OF CARE: Admit to inpatient   PATIENT DISPOSITION:  PACU - hemodynamically stable.   Delay start of Pharmacological VTE agent (>24hrs) due to surgical blood loss or risk of bleeding: no

## 2021-06-25 NOTE — Progress Notes (Signed)
Patient ID: Cindy Valdez, female   DOB: 1966/01/24, 55 y.o.   MRN: [id number] The patient understands fully that we are proceeding to surgery today to address her left hip displaced femoral neck fracture.  Our treatment plan is a left direct anterior hip replacement.  I discussed this with her in detail and described the surgery and what it involves.  There was discussion of the risk and benefits of surgery as well.  Informed consent is obtained and the left hip has been marked.  Her vital signs are stable.  Her hemoglobin is 8.5.  All questions and concerns were answered and addressed.

## 2021-06-25 NOTE — Progress Notes (Signed)
Orthopaedic Trauma Progress Note  SUBJECTIVE: Doing okay this morning, pain manageable. No chest pain. No SOB. No nausea/vomiting. No other complaints. Scheduled for procedure with Dr. Magnus Ivan later today  OBJECTIVE:  Vitals:   06/25/21 0440 06/25/21 0731  BP: (!) 152/73 (!) 153/73  Pulse: 83 84  Resp: 18 18  Temp: 98.6 F (37 C) 98.8 F (37.1 C)  SpO2: 100% 100%    General: Sitting up in bed, NAD. Currently on the phone  Respiratory: No increased work of breathing.  RLE: Dressings CDI. Able to wiggle toes. Endorses sensation to light touch throughout extremity. Compartments compressible.   IMAGING: Stable post op imaging.   LABS:  Results for orders placed or performed during the hospital encounter of 06/22/21 (from the past 24 hour(s))  Basic metabolic panel     Status: Abnormal   Collection Time: 06/25/21  3:45 AM  Result Value Ref Range   Sodium 138 135 - 145 mmol/L   Potassium 4.3 3.5 - 5.1 mmol/L   Chloride 107 98 - 111 mmol/L   CO2 25 22 - 32 mmol/L   Glucose, Bld 129 (H) 70 - 99 mg/dL   BUN 10 6 - 20 mg/dL   Creatinine, Ser 2.24 0.44 - 1.00 mg/dL   Calcium 7.6 (L) 8.9 - 10.3 mg/dL   GFR, Estimated >49 >75 mL/min   Anion gap 6 5 - 15    ASSESSMENT: Cindy Valdez is a 55 y.o. female, 1 Day Post-Op s/p INTRAMEDULLARY NAIL RIGHT TIBIA REMOVAL EXTERNAL FIXATION RIGHT LEG  CV/Blood loss: Hgb 11.6 pre-op, CBC pending this AM.   PLAN: Weightbearing: TDWB RLE Incisional and dressing care:  Plan to remove dressing tomorrow Showering: Hold off for now Orthopedic device(s): incisional vac RLE Pain management:  1. Tylenol 1000 mg q 8 hours scheduled 2. Robaxin 500 mg q 6 hours PRN 3. Oxycodone 5-10 mg q 4 hours PRN 4. Dilaudid 0.5-1 mg q 4 hours PRN VTE prophylaxis: holding Lovenox for procedure today, SCDs ID: Ceftriaxone post per open fracture protocol  Foley/Lines:  No foley, KVO IVFs Impediments to Fracture Healing: Vit D level 58, no supplementation  needed Dispo: Scheduled for THR today with dr. Magnus Ivan, PT/OT post-op as tolerated  Follow - up plan: 2 weeks after d/c for repeat x-rays and suture removal RLE  Contact information:  Truitt Merle MD, Ulyses Southward PA-C. After hours and holidays please check [email or web address] for group call information for [facility]   Cindy Frick A. Michaelyn Barter, PA-C [phone] (office) [email or web address]

## 2021-06-26 LAB — BPAM RBC
Blood Product Expiration Date: 202209152359
Blood Product Expiration Date: 202209202359
ISSUE DATE / TIME: 202209011421
ISSUE DATE / TIME: 202209011421
Unit Type and Rh: 7300
Unit Type and Rh: 7300

## 2021-06-26 LAB — BASIC METABOLIC PANEL
Anion gap: 6 (ref 5–15)
BUN: 11 mg/dL (ref 6–20)
CO2: 27 mmol/L (ref 22–32)
Calcium: 7.6 mg/dL — ABNORMAL LOW (ref 8.9–10.3)
Chloride: 105 mmol/L (ref 98–111)
Creatinine, Ser: 0.83 mg/dL (ref 0.44–1.00)
GFR, Estimated: >60 mL/min (ref >60)
Glucose, Bld: 143 mg/dL — ABNORMAL HIGH (ref 70–99)
Potassium: 3.9 mmol/L (ref 3.5–5.1)
Sodium: 138 mmol/L (ref 135–145)

## 2021-06-26 LAB — TYPE AND SCREEN
ABO/RH(D): B POS
Antibody Screen: NEGATIVE
Unit division: 0
Unit division: 0

## 2021-06-26 LAB — CBC
HCT: 26.6 % — ABNORMAL LOW (ref 36.0–46.0)
Hemoglobin: 8.7 g/dL — ABNORMAL LOW (ref 12.0–15.0)
MCH: 28.6 pg (ref 26.0–34.0)
MCHC: 32.7 g/dL (ref 30.0–36.0)
MCV: 87.5 fL (ref 80.0–100.0)
Platelets: 231 10*3/uL (ref 150–400)
RBC: 3.04 MIL/uL — ABNORMAL LOW (ref 3.87–5.11)
RDW: 14.3 % (ref 11.5–15.5)
WBC: 10.8 10*3/uL — ABNORMAL HIGH (ref 4.0–10.5)
nRBC: 0.6 % — ABNORMAL HIGH (ref 0.0–0.2)

## 2021-06-26 NOTE — Anesthesia Postprocedure Evaluation (Signed)
Anesthesia Post Note  Patient: Charliegh Vasudevan  Procedure(s) Performed: LEFT TOTAL HIP ARTHROPLASTY ANTERIOR APPROACH (Left: Hip)     Patient location during evaluation: PACU Anesthesia Type: General Level of consciousness: patient cooperative and awake Pain management: pain level controlled Vital Signs Assessment: post-procedure vital signs reviewed and stable Respiratory status: spontaneous breathing, nonlabored ventilation, respiratory function stable and patient connected to nasal cannula oxygen Cardiovascular status: blood pressure returned to baseline and stable Postop Assessment: no apparent nausea or vomiting Anesthetic complications: no   No notable events documented.  Last Vitals:  Vitals:   06/26/21 0449 06/26/21 0815  BP: (!) 143/62 134/63  Pulse: 87 88  Resp: 17 18  Temp: 37.6 C 37 C  SpO2: 100% 99%    Last Pain:  Vitals:   06/26/21 0815  TempSrc: Oral  PainSc:                  Chinelo Benn

## 2021-06-26 NOTE — Progress Notes (Signed)
Orthopaedic Trauma Progress Note  SUBJECTIVE: Doing okay this morning, pain in left hip. Asking for pain medication. No chest pain. No SOB. No nausea/vomiting. No other complaints. Has not been out of bed yet since surgery. Nervous about working with therapies today  OBJECTIVE:  Vitals:   06/26/21 0449 06/26/21 0815  BP: (!) 143/62 134/63  Pulse: 87 88  Resp: 17 18  Temp: 99.7 F (37.6 C) 98.6 F (37 C)  SpO2: 100% 99%    General: Laying in bed, NAD.  Respiratory: No increased work of breathing.  RLE: Dressings CDI. Incisional vac with good seal and function. No drainage in canister. Able to wiggle toes. Endorses sensation to light touch throughout extremity. Compartments compressible.   IMAGING: Stable post op imaging.   LABS:  Results for orders placed or performed during the hospital encounter of 06/22/21 (from the past 24 hour(s))  ABO/Rh     Status: None   Collection Time: 06/25/21  1:00 PM  Result Value Ref Range   ABO/RH(D)      B POS Performed at [facility], [address] [place], [place] [address]   Type and screen [facility]     Status: None   Collection Time: 06/25/21  1:05 PM  Result Value Ref Range   ABO/RH(D) B POS    Antibody Screen NEG    Sample Expiration 06/28/2021,2359    Unit Number [id number]    Blood Component Type RED CELLS,LR    Unit division 00    Status of Unit ISSUED,FINAL    Transfusion Status OK TO TRANSFUSE    Crossmatch Result      Compatible Performed at [facility], [address] [place], [place] [address]    Unit Number [id number]    Blood Component Type RED CELLS,LR    Unit division 00    Status of Unit ISSUED,FINAL    Transfusion Status OK TO TRANSFUSE    Crossmatch Result Compatible   Prepare RBC (crossmatch)     Status: None   Collection Time: 06/25/21  2:12 PM  Result Value Ref Range   Order Confirmation      ORDER PROCESSED BY BLOOD BANK Performed at [facility],  [address], [place], [place] [address]   Prepare RBC (crossmatch)     Status: None   Collection Time: 06/25/21  2:30 PM  Result Value Ref Range   Order Confirmation      ORDER PROCESSED BY BLOOD BANK DUPLICATE REQUEST ADDITIONAL ORDER PUT IN FOR 2 UNITS INSTEAD OF 1 UNIT Performed at [facility], [address] [place], [place] [address]   I-STAT, [place] 8     Status: Abnormal   Collection Time: 06/25/21  2:35 PM  Result Value Ref Range   Sodium 140 135 - 145 mmol/L   Potassium 3.6 3.5 - 5.1 mmol/L   Chloride 105 98 - 111 mmol/L   BUN 11 6 - 20 mg/dL   Creatinine, Ser 5.70 0.44 - 1.00 mg/dL   Glucose, Bld 177 (H) 70 - 99 mg/dL   Calcium, Ion 9.39 (L) 1.15 - 1.40 mmol/L   TCO2 24 22 - 32 mmol/L   Hemoglobin 7.1 (L) 12.0 - 15.0 g/dL   HCT 03.0 (L) 09.2 - 33.0 %  Basic metabolic panel     Status: Abnormal   Collection Time: 06/26/21  4:46 AM  Result Value Ref Range   Sodium 138 135 - 145 mmol/L  Potassium 3.9 3.5 - 5.1 mmol/L   Chloride 105 98 - 111 mmol/L   CO2 27 22 - 32 mmol/L   Glucose, Bld 143 (H) 70 - 99 mg/dL   BUN 11 6 - 20 mg/dL   Creatinine, Ser 5.32 0.44 - 1.00 mg/dL   Calcium 7.6 (L) 8.9 - 10.3 mg/dL   GFR, Estimated >99 >24 mL/min   Anion gap 6 5 - 15  CBC     Status: Abnormal   Collection Time: 06/26/21  4:46 AM  Result Value Ref Range   WBC 10.8 (H) 4.0 - 10.5 K/uL   RBC 3.04 (L) 3.87 - 5.11 MIL/uL   Hemoglobin 8.7 (L) 12.0 - 15.0 g/dL   HCT 26.8 (L) 34.1 - 96.2 %   MCV 87.5 80.0 - 100.0 fL   MCH 28.6 26.0 - 34.0 pg   MCHC 32.7 30.0 - 36.0 g/dL   RDW 22.9 79.8 - 92.1 %   Platelets 231 150 - 400 K/uL   nRBC 0.6 (H) 0.0 - 0.2 %    ASSESSMENT: Cindy Valdez is a 55 y.o. female s/p REMOVAL EXTERNAL FIXATION AND INTRAMEDULLARY NAIL RIGHT TIBIA 06/24/21 LEFT TOTAL HIP ARTHOPLASTY 06/25/21 BY DR. Magnus Ivan  CV/Blood loss: ABLA, Hgb 8.7 this AM. Received 1 unit PRBCs peri-operatively yesterday  PLAN: Weightbearing: TDWB RLE Incisional and dressing care:   Maintain RLE dressing, reinforce PRN Showering: Hold off for now Orthopedic device(s): Incisional vac RLE Pain management:  1. Tylenol 1000 mg q 8 hours scheduled 2. Robaxin 500 mg q 6 hours PRN 3. Oxycodone 5-15 mg q 4 hours PRN 4. Dilaudid 0.5-1 mg q 4 hours PRN VTE prophylaxis: Eliquis, SCDs ID: Ceftriaxone post per open fracture protocol to be completed today Foley/Lines:  No foley, KVO IVFs Impediments to Fracture Healing: Vit D level 58, no supplementation needed Dispo: PT/OT eval today, recommending CIR based on pre-op evaluation. Will plan to maintain RLE dressing until next week and likely remove incisional vac Tuesday or Wednesday  Follow - up plan: 2 weeks after d/c for repeat x-rays and suture removal RLE  Contact information:  Truitt Merle MD, Ulyses Southward PA-C. After hours and holidays please check [email or web address] for group call information for [facility]   Thurman Sarver A. Michaelyn Barter, PA-C [phone] (office) [email or web address]
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
## 2021-06-26 NOTE — Progress Notes (Signed)
Patient ID: Cindy Valdez, female   DOB: [birth year], 55 y.o.   MRN: [id number] The patient is resting comfortably this afternoon.  She did set up with physical therapy she reports.  According to the patient, the plan is to keep the incisional VAC on her right leg into the next week.  I think this is appropriate as well.  From a mobility standpoint, she will be weightbearing as tolerated on the left lower extremity but only touchdown weightbearing on the right lower extremity.  Her left hip dressing has some scant bloody drainage.  We will watch this closely.  We will likely end up consulting the inpatient rehab physicians for their input as to whether or not this is a good candidate for further therapy in their environment.  She is on Eliquis for DVT coverage.  All questions and concerns were answered and addressed.
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
## 2021-06-27 LAB — CBC
HCT: 27.4 % — ABNORMAL LOW (ref 36.0–46.0)
Hemoglobin: 8.9 g/dL — ABNORMAL LOW (ref 12.0–15.0)
MCH: 28.9 pg (ref 26.0–34.0)
MCHC: 32.5 g/dL (ref 30.0–36.0)
MCV: 89 fL (ref 80.0–100.0)
Platelets: 267 10*3/uL (ref 150–400)
RBC: 3.08 MIL/uL — ABNORMAL LOW (ref 3.87–5.11)
RDW: 14.4 % (ref 11.5–15.5)
WBC: 12.9 10*3/uL — ABNORMAL HIGH (ref 4.0–10.5)
nRBC: 0.6 % — ABNORMAL HIGH (ref 0.0–0.2)

## 2021-06-27 LAB — BASIC METABOLIC PANEL
Anion gap: 8 (ref 5–15)
BUN: 10 mg/dL (ref 6–20)
CO2: 24 mmol/L (ref 22–32)
Calcium: 7.6 mg/dL — ABNORMAL LOW (ref 8.9–10.3)
Chloride: 105 mmol/L (ref 98–111)
Creatinine, Ser: 0.8 mg/dL (ref 0.44–1.00)
GFR, Estimated: >60 mL/min (ref >60)
Glucose, Bld: 126 mg/dL — ABNORMAL HIGH (ref 70–99)
Potassium: 3.6 mmol/L (ref 3.5–5.1)
Sodium: 137 mmol/L (ref 135–145)

## 2021-06-27 MED ORDER — FERROUS GLUCONATE 324 (38 FE) MG PO TABS
324.0000 mg | ORAL_TABLET | Freq: Two times a day (BID) | ORAL | Status: DC
  Administered 2021-06-27 – 2021-07-12 (×31): 324 mg via ORAL
  Filled 2021-06-27 (×33): qty 1
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
## 2021-06-27 NOTE — Progress Notes (Signed)
     Subjective: 2 Days Post-Op Procedure(s) (LRB): LEFT TOTAL HIP ARTHROPLASTY ANTERIOR APPROACH (Left) Awake, alert and oriented x 4. Dressing left hip is dry, has some ecchymosis left lower abdomenal wall.  Patient reports pain as moderate.    Objective:   VITALS:  Temp:  [97.8 F (36.6 C)-100.2 F (37.9 C)] 98.6 F (37 C) (09/03 0828) Pulse Rate:  [86-101] 88 (09/03 0828) Resp:  [17-18] 18 (09/03 0828) BP: (137-152)/(56-74) 137/64 (09/03 0828) SpO2:  [95 %-100 %] 100 % (09/03 0828)  Neurologically intact ABD soft Neurovascular intact Sensation intact distally Intact pulses distally Dorsiflexion/Plantar flexion intact Incision: dressing C/D/I and scant drainage No cellulitis present Compartment soft   LABS Recent Labs    06/25/21 0936 06/25/21 1435 06/26/21 0446 06/27/21 0013  HGB 8.5* 7.1* 8.7* 8.9*  WBC 12.3*  --  10.8* 12.9*  PLT 206  --  231 267   Recent Labs    06/26/21 0446 06/27/21 0013  NA 138 137  K 3.9 3.6  CL 105 105  CO2 27 24  BUN 11 10  CREATININE 0.83 0.80  GLUCOSE 143* 126*   No results for input(s): LABPT, INR in the last 72 hours.   Assessment/Plan: 2 Days Post-Op Procedure(s) (LRB): LEFT TOTAL HIP ARTHROPLASTY ANTERIOR APPROACH (Left) Anemia due to blood loss, injury and periop  Advance diet Up with therapy Discharge to SNF 2 extremity injuries with morbid obesity will likely need SNF post hospital. Encourage cough and deep breath and AROM of the LEs, ankle and feet.   Vira Browns 06/27/2021, 10:46 AM Patient ID: Cindy Valdez, female   DOB: Jul 10, 1966, 55 y.o.   MRN: [id number]

## 2021-06-28 ENCOUNTER — Encounter (HOSPITAL_COMMUNITY): Payer: Self-pay | Admitting: Orthopaedic Surgery

## 2021-06-28 LAB — CBC WITH DIFFERENTIAL/PLATELET
Abs Immature Granulocytes: 0.49 10*3/uL — ABNORMAL HIGH (ref 0.00–0.07)
Basophils Absolute: 0.1 10*3/uL (ref 0.0–0.1)
Basophils Relative: 1 %
Eosinophils Absolute: 0.4 10*3/uL (ref 0.0–0.5)
Eosinophils Relative: 3 %
HCT: 26.4 % — ABNORMAL LOW (ref 36.0–46.0)
Hemoglobin: 8.5 g/dL — ABNORMAL LOW (ref 12.0–15.0)
Immature Granulocytes: 4 %
Lymphocytes Relative: 13 %
Lymphs Abs: 1.7 10*3/uL (ref 0.7–4.0)
MCH: 28.9 pg (ref 26.0–34.0)
MCHC: 32.2 g/dL (ref 30.0–36.0)
MCV: 89.8 fL (ref 80.0–100.0)
Monocytes Absolute: 0.9 10*3/uL (ref 0.1–1.0)
Monocytes Relative: 7 %
Neutro Abs: 9.2 10*3/uL — ABNORMAL HIGH (ref 1.7–7.7)
Neutrophils Relative %: 72 %
Platelets: 281 10*3/uL (ref 150–400)
RBC: 2.94 MIL/uL — ABNORMAL LOW (ref 3.87–5.11)
RDW: 14.6 % (ref 11.5–15.5)
WBC: 12.8 10*3/uL — ABNORMAL HIGH (ref 4.0–10.5)
nRBC: 0.4 % — ABNORMAL HIGH (ref 0.0–0.2)
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
## 2021-06-28 NOTE — Progress Notes (Signed)
     Subjective: 3 Days Post-Op Procedure(s) (LRB): LEFT TOTAL HIP ARTHROPLASTY ANTERIOR APPROACH (Left) Awake, alert and oriented x 4. On oxygen per nasal cannula.   Patient reports pain as moderate.    Objective:   VITALS:  Temp:  [98.2 F (36.8 C)-98.9 F (37.2 C)] 98.8 F (37.1 C) (09/04 0848) Pulse Rate:  [83-98] 83 (09/04 0848) Resp:  [18] 18 (09/04 0848) BP: (136-158)/(69-71) 144/69 (09/04 0848) SpO2:  [97 %-100 %] 100 % (09/04 0848)  Neurologically intact ABD soft Neurovascular intact Sensation intact distally Intact pulses distally Dorsiflexion/Plantar flexion intact Incision: dressing C/D/I and scant drainage No cellulitis present Compartment soft Dressing with minimal drainage, not changed from yesterday.  LABS Recent Labs    06/25/21 1435 06/26/21 0446 06/26/21 0446 06/27/21 0013 06/28/21 0205  HGB 7.1* 8.7*  --  8.9* 8.5*  WBC  --  10.8*   < > 12.9* 12.8*  PLT  --  231   < > 267 281   < > = values in this interval not displayed.   Recent Labs    06/26/21 0446 06/27/21 0013  NA 138 137  K 3.9 3.6  CL 105 105  CO2 27 24  BUN 11 10  CREATININE 0.83 0.80  GLUCOSE 143* 126*   No results for input(s): LABPT, INR in the last 72 hours.   Assessment/Plan: 3 Days Post-Op Procedure(s) (LRB): LEFT TOTAL HIP ARTHROPLASTY ANTERIOR APPROACH (Left)  Advance diet Up with therapy Discharge to SNF  Vira Browns 06/28/2021, 10:23 AM Patient ID: Cindy Valdez, female   DOB: 06/22/1966, 55 y.o.   MRN: [id number]
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
## 2021-06-29 NOTE — Progress Notes (Signed)
Inpatient Rehab Admissions Coordinator:   I do not have a bed for this Pt. On CIR today. She will likely have a COBRA policy that could be reinstated once she is able to speak with HR at her old job. I will not pursue admission until that has been determined.   Megan Salon, MS, CCC-SLP Rehab Admissions Coordinator  [phone] (celll) [phone] (office)

## 2021-06-29 NOTE — Plan of Care (Signed)
  Problem: Pain Managment: Goal: General experience of comfort will improve Outcome: Progressing   
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
## 2021-06-29 NOTE — Progress Notes (Signed)
Patient ID: Cindy Valdez, female   DOB: 25-Nov-1965, 55 y.o.   MRN: [id number] No acute changes over the past 24 hours.  I did change her left knee dressing and her left hip dressing.  Those incisions look ok.  She does have an incisional VAC over her right tibia wound and this will likely be removed this week.  CIR has been consulted for further rehab.
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
## 2021-06-30 NOTE — Progress Notes (Signed)
Inpatient Rehab Admissions Coordinator:    I do not have a bed for this Pt. Today. States she is working on [employer]. I will hold of on admission until payor source is clear.   Megan Salon, MS, CCC-SLP Rehab Admissions Coordinator  [phone] (celll) [phone] (office)
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
## 2021-07-02 NOTE — Plan of Care (Signed)
  Problem: Education: Goal: Knowledge of General Education information will improve Description: Including pain rating scale, medication(s)/side effects and non-pharmacologic comfort measures Outcome: Progressing   Problem: Clinical Measurements: Goal: Ability to maintain clinical measurements within normal limits will improve Outcome: Progressing   Problem: Health Behavior/Discharge Planning: Goal: Ability to manage health-related needs will improve Outcome: Progressing   Problem: Elimination: Goal: Will not experience complications related to bowel motility Outcome: Progressing   Problem: Pain Managment: Goal: General experience of comfort will improve Outcome: Progressing   Problem: Safety: Goal: Ability to remain free from injury will improve Outcome: Progressing   Problem: Skin Integrity: Goal: Risk for impaired skin integrity will decrease Outcome: Progressing

## 2021-07-02 NOTE — Progress Notes (Signed)
Patient ID: Cindy Valdez, female   DOB: 06/02/1966, 55 y.o.   MRN: [id number] The patient is doing great medically.  Her vital signs are stable.  However, mobility has been what is keeping her in the hospital.  She is weightbearing as tolerated on the left lower extremity but only touchdown weightbearing on the right lower extremity.  Given the significance of her bilateral lower extremity injuries, this is certainly difficult issue in terms of mobility.  Therapy is working with her.  Disposition to inpatient rehab is pending insurance authorization.  If that ends up not being an option.  She may end up having to consider short-term skilled nursing.  Right now, she is not safe from a mobility standpoint for discharging to home with home health therapy.  She understands this as well.

## 2021-07-03 ENCOUNTER — Inpatient Hospital Stay (HOSPITAL_COMMUNITY): Payer: BC Managed Care – PPO

## 2021-07-03 DIAGNOSIS — M7989 Other specified soft tissue disorders: Secondary | ICD-10-CM

## 2021-07-03 MED ORDER — APIXABAN 5 MG PO TABS
5.0000 mg | ORAL_TABLET | Freq: Two times a day (BID) | ORAL | Status: DC
Start: 2021-07-10 — End: 2021-07-12
  Administered 2021-07-10 – 2021-07-12 (×4): 5 mg via ORAL
  Filled 2021-07-03 (×4): qty 1

## 2021-07-03 MED ORDER — APIXABAN 5 MG PO TABS
10.0000 mg | ORAL_TABLET | Freq: Two times a day (BID) | ORAL | Status: AC
  Administered 2021-07-03 – 2021-07-10 (×14): 10 mg via ORAL
  Filled 2021-07-03 (×15): qty 2

## 2021-07-03 MED ORDER — MECLIZINE HCL 25 MG PO TABS
25.0000 mg | ORAL_TABLET | Freq: Two times a day (BID) | ORAL | Status: DC | PRN
  Filled 2021-07-03: qty 1
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
## 2021-07-03 NOTE — Progress Notes (Signed)
Lower extremity venous bilateral study completed.  Preliminary results relayed to Nyra Capes, RN and Magnus Ivan, MD via secure chat.   See CV Proc for preliminary results report.   Jean Rosenthal, RDMS, RVT

## 2021-07-03 NOTE — Progress Notes (Signed)
IP rehab admissions - I met with patient at the bedside.  Patient called HR at her previous place of employment.  Patient says we may know something about cobra insurance possibility on Monday.  If cobra can be instated, we will then need authorization for CIR prior to admission.  Will follow up on Monday.  Call for questions.  [phone]
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
## 2021-07-05 NOTE — Progress Notes (Signed)
   Subjective:  No acute events overnight.  Resting comfortably.  Pain well controlled.   Objective:   VITALS:   Vitals:   07/04/21 1623 07/04/21 2032 07/05/21 0618 07/05/21 0754  BP: (!) 116/51 136/70 120/74 (!) 126/57  Pulse: 84 79 75 69  Resp: 18 17  17   Temp: 98.3 F (36.8 C) 98.5 F (36.9 C) 98.3 F (36.8 C) 98.1 F (36.7 C)  TempSrc: Oral Oral Oral Oral  SpO2: 100% 100% 100% 100%  Weight:      Height:        Gen: Resting comfortably  Pulm: Normal WOB on RA CV: Normal rate, bilateral extremities warm and well perfused BLE: Dressing remains clean and dry, 5/5 strength in BLE, SILT throughout    Lab Results  Component Value Date   WBC 12.8 (H) 06/28/2021   HGB 8.5 (L) 06/28/2021   HCT 26.4 (L) 06/28/2021   MCV 89.8 06/28/2021   PLT 281 06/28/2021     Assessment/Plan:  10 Days Post-Op   Continue to work w/ therapy; up to bedside chair yesterday Now on therapeutic dose eliquis for RLE DVT Potential IPR early next week  Cindy Valdez 07/05/2021, 10:12 AM [phone]

## 2021-07-06 MED ORDER — POLYETHYLENE GLYCOL 3350 17 G PO PACK
17.0000 g | PACK | Freq: Every day | ORAL | Status: DC
  Administered 2021-07-07 – 2021-07-11 (×4): 17 g via ORAL
  Filled 2021-07-06 (×6): qty 1

## 2021-07-06 NOTE — Discharge Instructions (Addendum)
Information on my medicine - ELIQUIS (apixaban)   Why was Eliquis prescribed for you? Eliquis was prescribed to treat blood clots that may have been found in the veins of your legs (deep vein thrombosis) or in your lungs (pulmonary embolism) and to reduce the risk of them occurring again.  What do You need to know about Eliquis ? The starting dose is 10 mg (two 5 mg tablets) taken TWICE daily for the FIRST SEVEN (7) DAYS, then on 07/10/2021 the dose is reduced to ONE 5 mg tablet taken TWICE daily.  Eliquis may be taken with or without food.   Try to take the dose about the same time in the morning and in the evening. If you have difficulty swallowing the tablet whole please discuss with your pharmacist how to take the medication safely.  Take Eliquis exactly as prescribed and DO NOT stop taking Eliquis without talking to the doctor who prescribed the medication.  Stopping may increase your risk of developing a new blood clot.  Refill your prescription before you run out.  After discharge, you should have regular check-up appointments with your healthcare provider that is prescribing your Eliquis.    What do you do if you miss a dose? If a dose of ELIQUIS is not taken at the scheduled time, take it as soon as possible on the same day and twice-daily administration should be resumed. The dose should not be doubled to make up for a missed dose.  Important Safety Information A possible side effect of Eliquis is bleeding. You should call your healthcare provider right away if you experience any of the following: Bleeding from an injury or your nose that does not stop. Unusual colored urine (red or dark brown) or unusual colored stools (red or black). Unusual bruising for unknown reasons. A serious fall or if you hit your head (even if there is no bleeding).  Some medicines may interact with Eliquis and might increase your risk of bleeding or clotting while on Eliquis. To help avoid  this, consult your healthcare provider or pharmacist prior to using any new prescription or non-prescription medications, including herbals, vitamins, non-steroidal anti-inflammatory drugs (NSAIDs) and supplements.  This website has more information on Eliquis (apixaban): http://www.eliquis.com/eliquis/home
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
## 2021-07-06 NOTE — Progress Notes (Signed)
Inpatient Rehab Admissions Coordinator:   I do not have a bed for this pt. On CIR. Pt. States that her former employer is supposed to call with her COBRA information today. Once I have  the policy information, I will confirm that the plan is active and open a case for insurance auth.   Megan Salon, MS, CCC-SLP Rehab Admissions Coordinator  [phone] (celll) [phone] (office)
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
## 2021-07-07 NOTE — Progress Notes (Signed)
Unable to flush IV in left AC. IV removed intact. Pt refused to have new IV placed. No order in place to maintain IV access so IV not replaced.
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
## 2021-07-07 NOTE — Progress Notes (Signed)
Inpatient Rehab Admissions Coordinator:    I continue to await instatement of COBRA policy. I will call and Pt. Will call to check on the status today.Megan Salon, MS, CCC-SLP Rehab Admissions Coordinator  [phone] (celll) [phone] (office)
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
## 2021-07-08 ENCOUNTER — Inpatient Hospital Stay (HOSPITAL_COMMUNITY): Payer: BC Managed Care – PPO

## 2021-07-08 LAB — CBC
HCT: 27.6 % — ABNORMAL LOW (ref 36.0–46.0)
Hemoglobin: 8.5 g/dL — ABNORMAL LOW (ref 12.0–15.0)
MCH: 28.9 pg (ref 26.0–34.0)
MCHC: 30.8 g/dL (ref 30.0–36.0)
MCV: 93.9 fL (ref 80.0–100.0)
Platelets: 585 10*3/uL — ABNORMAL HIGH (ref 150–400)
RBC: 2.94 MIL/uL — ABNORMAL LOW (ref 3.87–5.11)
RDW: 16.9 % — ABNORMAL HIGH (ref 11.5–15.5)
WBC: 7.4 10*3/uL (ref 4.0–10.5)
nRBC: 0 % (ref 0.0–0.2)

## 2021-07-08 NOTE — Progress Notes (Signed)
Patient ID: Cindy Valdez, female   DOB: 1966/04/29, 55 y.o.   MRN: [id number] There is been no acute changes in the last 24 hours.  The patient is very motivated.  I did assess all her incisions today.  The left hip incision is worrisome in terms of just where it is in the groin crease in terms of staying a little damp but there is no evidence of infection or breakdown.  I placed new dry dressing up in that area.  Her bilateral lower extremity wounds otherwise look great and the sutures are all intact.  She is on Eliquis for DVT.  CRR is still pending insurance authorization and approval.

## 2021-07-08 NOTE — Progress Notes (Signed)
Orthopaedic Trauma Progress Note  SUBJECTIVE: Doing fairly well today. Remains stable for discharge to CIR. Notes she got COBRA insurance re-instated so is hopeful process will move quicker now.  No other complaints today.    OBJECTIVE:  Vitals:   07/08/21 0542 07/08/21 0842  BP: 119/66 128/71  Pulse: 78 79  Resp: 18 16  Temp: 98.2 F (36.8 C) 98.4 F (36.9 C)  SpO2: 100% 100%    General: Sitting up in bed, NAD.  Respiratory: No increased work of breathing.  RLE: Incision over knee and traumatic wound to anterior tibia are clean, dry, intact. Sutures removed from knee. Left in place over traumatic wound and lower leg. Sligth redness around traumatic wound but no drainage, no fluctuance. Bruising and swelling through leg stable. Mildly tender with palption thorugh calf. Able to wiggle toes. Endorses sensation to light touch throughout extremity.  Ankle DF/PF intact.  Compartments compressible.  IMAGING: Stable post op imaging. Repeat imaging R tibia ordered today  LABS:  Results for orders placed or performed during the hospital encounter of 06/22/21 (from the past 24 hour(s))  CBC     Status: Abnormal   Collection Time: 07/08/21  1:48 AM  Result Value Ref Range   WBC 7.4 4.0 - 10.5 K/uL   RBC 2.94 (L) 3.87 - 5.11 MIL/uL   Hemoglobin 8.5 (L) 12.0 - 15.0 g/dL   HCT 87.8 (L) 67.6 - 72.0 %   MCV 93.9 80.0 - 100.0 fL   MCH 28.9 26.0 - 34.0 pg   MCHC 30.8 30.0 - 36.0 g/dL   RDW 94.7 (H) 09.6 - 28.3 %   Platelets 585 (H) 150 - 400 K/uL   nRBC 0.0 0.0 - 0.2 %     ASSESSMENT: Cindy Valdez is a 55 y.o. female s/p REMOVAL EXTERNAL FIXATION AND INTRAMEDULLARY NAIL RIGHT TIBIA 06/24/21 LEFT TOTAL HIP ARTHOPLASTY 06/25/21 BY DR. Magnus Ivan  CV/Blood loss: Hgb 8.5 this AM stable. Hemodynamically stable  PLAN: Weightbearing: TDWB RLE Incisional and dressing care: Sutures removed from R knee and thigh today. Ok to leave open to air Showering: Ok to shower, RLE may get wet. Clean with soap  and water. Do not submerge Orthopedic device(s): None Pain management:  1. Tylenol 1000 mg q 8 hours scheduled 2. Robaxin 500 mg q 6 hours PRN 3. Oxycodone 5-15 mg q 4 hours PRN 4. Dilaudid 0.5-1 mg q 4 hours PRN VTE prophylaxis: Eliquis for DVT treatment, SCDs ID: Ceftriaxone post per open fracture protocol completed  Foley/Lines:  No foley, KVO IVFs Impediments to Fracture Healing: Vit D level 58, no supplementation needed  Dispo: Therapies as tolerated.Hopefully d/c to CIR once bed available. Plan to keep an eye on traumatic wound R anterior tibia and remove sutures later this week vs early next week.   Follow - up plan: We will continue to follow while in hospital and plan for outpatient follow-up 2 weeks after d/c for repeat x-rays   Contact information:  Truitt Merle MD, Ulyses Southward PA-C. After hours and holidays please check [email or web address] for group call information for [facility]   Jaylenn Baiza A. Michaelyn Barter, PA-C [phone] (office) [email or web address]

## 2021-07-08 NOTE — Progress Notes (Signed)
Inpatient Rehab Admissions Coordinator:    I spoke with Pt. And retrieved her forms for insurance enrollments that she had filled out. I sent them to her former employer early  this morning. Hopefully, she should have an active policy within a few days and we can apply for prior auth for CIR at that time.   Megan Salon, MS, CCC-SLP Rehab Admissions Coordinator  [phone] (celll) [phone] (office)
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
## 2021-07-09 NOTE — Progress Notes (Signed)
Patient ID: Houa Nie, female   DOB: 1966-04-19, 55 y.o.   MRN: [id number] The patient is 2 weeks today status post a left total hip arthroplasty secondary to her traumatic left hip femoral neck fracture.  She is also just over 2 weeks yesterday status post intramedullary nail fixation of a complex segmental right tibia fracture.  Some sutures have been removed from her wounds with some are being kept in until out a little bit more maturity of the soft tissue.  She is very motivated.  She has been working with therapy while she is here.  She remains weightbearing as tolerated on the left lower extremity but touchdown weightbearing on the right lower extremity.  CIR is pending insurance authorization.  She is still on Eliquis which she will be on for several months secondary to her lower extremity DVT.  Therapy is still doing a good job working with her while she is in an acute care setting.

## 2021-07-10 DIAGNOSIS — G4733 Obstructive sleep apnea (adult) (pediatric): Secondary | ICD-10-CM | POA: Diagnosis present

## 2021-07-10 DIAGNOSIS — Z20822 Contact with and (suspected) exposure to covid-19: Secondary | ICD-10-CM | POA: Diagnosis present

## 2021-07-10 DIAGNOSIS — Z96642 Presence of left artificial hip joint: Secondary | ICD-10-CM | POA: Diagnosis not present

## 2021-07-10 DIAGNOSIS — I82441 Acute embolism and thrombosis of right tibial vein: Secondary | ICD-10-CM | POA: Diagnosis present

## 2021-07-10 DIAGNOSIS — S82251C Displaced comminuted fracture of shaft of right tibia, initial encounter for open fracture type IIIA, IIIB, or IIIC: Secondary | ICD-10-CM | POA: Diagnosis present

## 2021-07-10 DIAGNOSIS — T1490XA Injury, unspecified, initial encounter: Secondary | ICD-10-CM | POA: Diagnosis present

## 2021-07-10 DIAGNOSIS — K5903 Drug induced constipation: Secondary | ICD-10-CM | POA: Diagnosis present

## 2021-07-10 DIAGNOSIS — Z87891 Personal history of nicotine dependence: Secondary | ICD-10-CM | POA: Diagnosis not present

## 2021-07-10 DIAGNOSIS — K219 Gastro-esophageal reflux disease without esophagitis: Secondary | ICD-10-CM | POA: Diagnosis present

## 2021-07-10 DIAGNOSIS — Y92488 Other paved roadways as the place of occurrence of the external cause: Secondary | ICD-10-CM | POA: Diagnosis not present

## 2021-07-10 DIAGNOSIS — G8918 Other acute postprocedural pain: Secondary | ICD-10-CM | POA: Diagnosis not present

## 2021-07-10 DIAGNOSIS — S82451C Displaced comminuted fracture of shaft of right fibula, initial encounter for open fracture type IIIA, IIIB, or IIIC: Secondary | ICD-10-CM | POA: Diagnosis present

## 2021-07-10 DIAGNOSIS — S72002A Fracture of unspecified part of neck of left femur, initial encounter for closed fracture: Secondary | ICD-10-CM | POA: Diagnosis present

## 2021-07-10 DIAGNOSIS — Z23 Encounter for immunization: Secondary | ICD-10-CM | POA: Diagnosis not present

## 2021-07-10 DIAGNOSIS — T148XXA Other injury of unspecified body region, initial encounter: Secondary | ICD-10-CM | POA: Diagnosis not present

## 2021-07-10 DIAGNOSIS — D62 Acute posthemorrhagic anemia: Secondary | ICD-10-CM | POA: Diagnosis not present

## 2021-07-10 DIAGNOSIS — Z6841 Body Mass Index (BMI) 40.0 and over, adult: Secondary | ICD-10-CM | POA: Diagnosis not present

## 2021-07-10 NOTE — Progress Notes (Signed)
Patient ID: Cindy Valdez, female   DOB: 03-27-1966, 55 y.o.   MRN: [id number] The patient is doing well overall.  Fortunately both PT and OT have continue to work with her while CIR is pending authorization and approval.  Both her lower extremity injuries with all incisions are doing well.  She continues to be on Eliquis for her DVT and will be on this for 3 months.  She will continue PT and OT on the inpatient side while she is pending disposition.
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
## 2021-07-12 ENCOUNTER — Encounter (HOSPITAL_COMMUNITY): Payer: Self-pay | Admitting: Physical Medicine and Rehabilitation

## 2021-07-12 ENCOUNTER — Inpatient Hospital Stay (HOSPITAL_COMMUNITY)
Admission: EM | Admit: 2021-07-12 | Discharge: 2021-07-23 | DRG: 560 | Disposition: A | Discharge status: Home / Self Care | Payer: BLUE CROSS/BLUE SHIELD | Source: Intra-hospital | Attending: Physical Medicine and Rehabilitation | Admitting: Physical Medicine and Rehabilitation

## 2021-07-12 DIAGNOSIS — Z96649 Presence of unspecified artificial hip joint: Secondary | ICD-10-CM

## 2021-07-12 DIAGNOSIS — G8918 Other acute postprocedural pain: Secondary | ICD-10-CM | POA: Diagnosis present

## 2021-07-12 DIAGNOSIS — S82201E Unspecified fracture of shaft of right tibia, subsequent encounter for open fracture type I or II with routine healing: Secondary | ICD-10-CM | POA: Diagnosis present

## 2021-07-12 DIAGNOSIS — T1490XA Injury, unspecified, initial encounter: Secondary | ICD-10-CM

## 2021-07-12 DIAGNOSIS — G4733 Obstructive sleep apnea (adult) (pediatric): Secondary | ICD-10-CM | POA: Diagnosis present

## 2021-07-12 DIAGNOSIS — S72002D Fracture of unspecified part of neck of left femur, subsequent encounter for closed fracture with routine healing: Secondary | ICD-10-CM

## 2021-07-12 DIAGNOSIS — D62 Acute posthemorrhagic anemia: Secondary | ICD-10-CM

## 2021-07-12 DIAGNOSIS — K5903 Drug induced constipation: Secondary | ICD-10-CM | POA: Diagnosis present

## 2021-07-12 DIAGNOSIS — S82401E Unspecified fracture of shaft of right fibula, subsequent encounter for open fracture type I or II with routine healing: Secondary | ICD-10-CM

## 2021-07-12 DIAGNOSIS — Z79899 Other long term (current) drug therapy: Secondary | ICD-10-CM

## 2021-07-12 DIAGNOSIS — Z96642 Presence of left artificial hip joint: Secondary | ICD-10-CM

## 2021-07-12 DIAGNOSIS — Z6841 Body Mass Index (BMI) 40.0 and over, adult: Secondary | ICD-10-CM

## 2021-07-12 DIAGNOSIS — T8133XA Disruption of traumatic injury wound repair, initial encounter: Secondary | ICD-10-CM | POA: Diagnosis not present

## 2021-07-12 DIAGNOSIS — Z87891 Personal history of nicotine dependence: Secondary | ICD-10-CM

## 2021-07-12 DIAGNOSIS — K219 Gastro-esophageal reflux disease without esophagitis: Secondary | ICD-10-CM | POA: Diagnosis present

## 2021-07-12 DIAGNOSIS — R609 Edema, unspecified: Secondary | ICD-10-CM | POA: Diagnosis not present

## 2021-07-12 DIAGNOSIS — T148XXA Other injury of unspecified body region, initial encounter: Secondary | ICD-10-CM

## 2021-07-12 DIAGNOSIS — Y838 Other surgical procedures as the cause of abnormal reaction of the patient, or of later complication, without mention of misadventure at the time of the procedure: Secondary | ICD-10-CM | POA: Diagnosis not present

## 2021-07-12 MED ORDER — ONDANSETRON HCL 4 MG/2ML IJ SOLN: 4.0000 mg | Freq: Four times a day (QID) | INTRAMUSCULAR | Status: DC | PRN

## 2021-07-12 MED ORDER — HYDRALAZINE HCL 50 MG PO TABS: 50.0000 mg | ORAL_TABLET | Freq: Four times a day (QID) | ORAL | Status: DC | PRN

## 2021-07-12 MED ORDER — OXYCODONE HCL 5 MG PO TABS
10.0000 mg | ORAL_TABLET | ORAL | Status: DC | PRN
  Administered 2021-07-12 – 2021-07-13 (×2): 15 mg via ORAL
  Administered 2021-07-13: 10 mg via ORAL
  Administered 2021-07-13 – 2021-07-16 (×3): 15 mg via ORAL
  Administered 2021-07-16: 10 mg via ORAL
  Administered 2021-07-16 – 2021-07-19 (×5): 15 mg via ORAL
  Filled 2021-07-12 (×4): qty 3
  Filled 2021-07-12 (×2): qty 2
  Filled 2021-07-12 (×7): qty 3

## 2021-07-12 MED ORDER — METHOCARBAMOL 1000 MG/10ML IJ SOLN
500.0000 mg | Freq: Four times a day (QID) | INTRAVENOUS | Status: DC | PRN
  Filled 2021-07-12: qty 5

## 2021-07-12 MED ORDER — DOCUSATE SODIUM 100 MG PO CAPS
100.0000 mg | ORAL_CAPSULE | Freq: Two times a day (BID) | ORAL | Status: DC
  Administered 2021-07-12 – 2021-07-23 (×22): 100 mg via ORAL
  Filled 2021-07-12 (×23): qty 1

## 2021-07-12 MED ORDER — OXYCODONE HCL 5 MG PO TABS
5.0000 mg | ORAL_TABLET | ORAL | Status: DC | PRN
  Administered 2021-07-13: 10 mg via ORAL
  Administered 2021-07-14: 5 mg via ORAL
  Administered 2021-07-14 – 2021-07-16 (×6): 10 mg via ORAL
  Administered 2021-07-17: 5 mg via ORAL
  Administered 2021-07-17 – 2021-07-18 (×2): 10 mg via ORAL
  Administered 2021-07-19 (×2): 5 mg via ORAL
  Administered 2021-07-20: 10 mg via ORAL
  Filled 2021-07-12 (×7): qty 2
  Filled 2021-07-12: qty 1
  Filled 2021-07-12 (×4): qty 2
  Filled 2021-07-12: qty 1
  Filled 2021-07-12 (×2): qty 2

## 2021-07-12 MED ORDER — POLYETHYLENE GLYCOL 3350 17 G PO PACK
17.0000 g | PACK | Freq: Every day | ORAL | Status: DC
  Administered 2021-07-13 – 2021-07-19 (×6): 17 g via ORAL
  Filled 2021-07-12 (×12): qty 1

## 2021-07-12 MED ORDER — PROSIGHT PO TABS
1.0000 | ORAL_TABLET | Freq: Every day | ORAL | Status: DC
  Administered 2021-07-13 – 2021-07-22 (×10): 1 via ORAL
  Filled 2021-07-12 (×10): qty 1

## 2021-07-12 MED ORDER — METHOCARBAMOL 500 MG PO TABS
500.0000 mg | ORAL_TABLET | Freq: Four times a day (QID) | ORAL | Status: DC | PRN
  Administered 2021-07-12 – 2021-07-21 (×9): 500 mg via ORAL
  Filled 2021-07-12 (×16): qty 1

## 2021-07-12 MED ORDER — ACETAMINOPHEN 325 MG PO TABS
325.0000 mg | ORAL_TABLET | Freq: Four times a day (QID) | ORAL | Status: DC | PRN
  Administered 2021-07-12 – 2021-07-21 (×5): 650 mg via ORAL
  Filled 2021-07-12 (×6): qty 2

## 2021-07-12 MED ORDER — ONDANSETRON HCL 4 MG PO TABS: 4.0000 mg | ORAL_TABLET | Freq: Four times a day (QID) | ORAL | Status: DC | PRN

## 2021-07-12 MED ORDER — FERROUS GLUCONATE 324 (38 FE) MG PO TABS
324.0000 mg | ORAL_TABLET | Freq: Two times a day (BID) | ORAL | Status: DC
  Administered 2021-07-12 – 2021-07-23 (×22): 324 mg via ORAL
  Filled 2021-07-12 (×24): qty 1

## 2021-07-12 MED ORDER — APIXABAN 5 MG PO TABS
5.0000 mg | ORAL_TABLET | Freq: Two times a day (BID) | ORAL | Status: DC
Start: 2021-07-12 — End: 2021-07-23
  Administered 2021-07-12 – 2021-07-23 (×22): 5 mg via ORAL
  Filled 2021-07-12 (×23): qty 1

## 2021-07-12 MED ORDER — OCUVITE-LUTEIN PO CAPS
1.0000 | ORAL_CAPSULE | Freq: Two times a day (BID) | ORAL | Status: DC
  Filled 2021-07-12 (×2): qty 1

## 2021-07-12 MED ORDER — ALUM & MAG HYDROXIDE-SIMETH 200-200-20 MG/5ML PO SUSP: 30.0000 mL | ORAL | Status: DC | PRN

## 2021-07-12 MED ORDER — PANTOPRAZOLE SODIUM 40 MG PO TBEC
40.0000 mg | DELAYED_RELEASE_TABLET | Freq: Every day | ORAL | Status: DC
  Administered 2021-07-13 – 2021-07-23 (×11): 40 mg via ORAL
  Filled 2021-07-12 (×12): qty 1
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
## 2021-07-12 NOTE — Progress Notes (Signed)
Inpatient Rehab Admissions Coordinator:  There is a bed available for pt in CIR today. Dr. Lajoyce Corners made aware and in agreement. Pt, TOC, and NSG made aware.   Wolfgang Phoenix, MS, CCC-SLP Admissions Coordinator [phone]

## 2021-07-12 NOTE — Discharge Summary (Signed)
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 Constipation Depression Respiratory depression And more.  Withdrawal symptoms can include Flu like symptoms Nausea, vomiting And more Techniques to manage these symptoms Hydrate well Eat regular healthy meals Stay active Use relaxation techniques(deep breathing, meditating, yoga) Do Not substitute Alcohol to help with tapering If you have been on opioids for less than two weeks and do not have pain than it is ok to stop all together.  Plan to wean off of opioids This plan should start within one week post op of your joint replacement. Maintain the same interval or time between taking each dose and first decrease the dose.  Cut the total daily intake of opioids by one tablet each day Next start to increase the time between doses. The last dose that should be eliminated is the evening dose.          Follow-up Information     [facility] at [facility]. Call.   Specialty: Family Medicine Why: establish a primary  care doctor Contact information: [address] [place] [address] [phone]        Kathryne Hitch, MD Follow up in 1 week(s).   Specialty: Orthopedic Surgery Contact information: [address] [place] [address] [phone]                  Signed: Nadara Mustard 07/12/2021, 10:25 AM

## 2021-07-12 NOTE — Progress Notes (Signed)
Pt arrived to unit via bed, pt is alert and oriented, able to make needs known. Pt oriented to rehab.
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
## 2021-07-12 NOTE — Progress Notes (Addendum)
Inpatient Rehabilitation Medication Review by a Pharmacist  A complete drug regimen review was completed for this patient to identify any potential clinically significant medication issues.  High Risk Drug Classes Is patient taking? Indication by Medication  Antipsychotic No   Anticoagulant Yes Apixaban- left total hip arthroplasty   Antibiotic No   Opioid Yes Oxycodone PRN pain   Antiplatelet No   Hypoglycemics/insulin No   Vasoactive Medication No   Chemotherapy No   Other No    Type of Medication Issue Identified Description of Issue Recommendation(s)  Drug Interaction(s) (clinically significant)     Duplicate Therapy     Allergy     No Medication Administration End Date     Incorrect Dose     Additional Drug Therapy Needed     Significant med changes from prior encounter (inform family/care partners about these prior to discharge).    Other      Clinically significant medication issues were identified that warrant physician communication and completion of prescribed/recommended actions by midnight of the next day:  No  Name of provider notified for urgent issues identified: [facility]  Provider Method of Notification: Secure chats   Pharmacist comments: No interventions, apixaban was resumed.   Time spent performing this drug regimen review (minutes):  10  Rushie Goltz, Pharm.D. PGY-1 Pharmacy Resident [phone] 07/12/2021 3:05 PM
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
## 2021-07-13 ENCOUNTER — Other Ambulatory Visit: Payer: Self-pay

## 2021-07-13 DIAGNOSIS — T1490XA Injury, unspecified, initial encounter: Secondary | ICD-10-CM

## 2021-07-13 NOTE — Progress Notes (Signed)
Slept good. PRN robaxin and Oxy IR 15mg 's given at 2017 & oxy ir 15mg 's given at 0632.  Mostly reports pain starts at right knee down to right foot-"sore & stinging" pain. Also, reports burning pain from left hip incision, wrapping around left thigh. BLE edema. Admitted to unit with pure wick in place, requested to keep overnight, remove prior to first therapy. 02 at HS only, R/T sleep apnea. A
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
## 2021-07-13 NOTE — Progress Notes (Signed)
Inpatient Rehabilitation  Patient information reviewed and entered into eRehab system by Zoanne Newill M. Aino Heckert, M.A., CCC/SLP, PPS Coordinator.  Information including medical coding, functional ability and quality indicators will be reviewed and updated through discharge.    

## 2021-07-13 NOTE — Progress Notes (Signed)
Physical Therapy Session Note  Patient Details  Name: Cindy Valdez MRN: [id number] Date of Birth: 05-21-1966  Today's Date: 07/13/2021 PT Individual Time: 4268-3419 PT Individual Time Calculation (min): 58 min   Short Term Goals: Week 1:  PT Short Term Goal 1 (Week 1): Patient will be able to complete bed mobility with MinA consistently PT Short Term Goal 2 (Week 1): Patient will complete bed <> wc transfer with LRAD and CGA consistently PT Short Term Goal 3 (Week 1): Patient will complete STS with LRAD and MinA consistently PT Short Term Goal 4 (Week 1): Patient will ambulate 79ft with LRAD and assist as needed  Skilled Therapeutic Interventions/Progress Updates:  Patient received supine in bed, agreeable to PT. She reports 4/10 pain primarily in L thigh, premedicated. PT providing resting breaks, distractions, repositioning to assist with pain management. She requested to use the bathroom prior to session. No bariatric drop arm commode available, so female urinal was utilized. MinA for clothing management to knees supine in bed. TotalA for placing urinal. Continent of bladder. SetupA for perihygiene. MaxA to don pants in bed. MinA to come sit edge of bed. MinA + extended time to transfer to wc via slideboard leading R. PT transporting patient in wc to therapy gym for time management and energy conservation. PT educating patient in s/s of infection- she verbalized understanding. PT also educating patient on scar management to promote increased pliability once healed and decrease scar tissue mal-alignment. Patient completing AAROM knee flex/extend, ankle pumps, hip flex. All movements limited by pain/weakness, though patient tolerated well. Patient returning to room in wc. She transferred back to bed via slideboard with CGA and extended time. ModA to return supine. Bed alarm on, call light within reach.   Therapy Documentation Precautions:  Precautions Precautions: Fall Restrictions Weight  Bearing Restrictions: Yes RLE Weight Bearing: Touchdown weight bearing LLE Weight Bearing: Weight bearing as tolerated Other Position/Activity Restrictions: left anterior hip precautions    Therapy/Group: Individual Therapy  Elizebeth Koller, PT, DPT, CBIS  07/13/2021, 3:44 PM
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
## 2021-07-13 NOTE — Plan of Care (Signed)
  Problem: RH Balance Goal: LTG Patient will maintain dynamic sitting balance (PT) Description: LTG:  Patient will maintain dynamic sitting balance with assistance during mobility activities (PT) Flowsheets (Taken 07/13/2021 1210) LTG: Pt will maintain dynamic sitting balance during mobility activities with:: Independent Goal: LTG Patient will maintain dynamic standing balance (PT) Description: LTG:  Patient will maintain dynamic standing balance with assistance during mobility activities (PT) Flowsheets (Taken 07/13/2021 1210) LTG: Pt will maintain dynamic standing balance during mobility activities with:: Supervision/Verbal cueing   Problem: Sit to Stand Goal: LTG:  Patient will perform sit to stand with assistance level (PT) Description: LTG:  Patient will perform sit to stand with assistance level (PT) Flowsheets (Taken 07/13/2021 1210) LTG: PT will perform sit to stand in preparation for functional mobility with assistance level: Supervision/Verbal cueing   Problem: RH Bed Mobility Goal: LTG Patient will perform bed mobility with assist (PT) Description: LTG: Patient will perform bed mobility with assistance, with/without cues (PT). Flowsheets (Taken 07/13/2021 1210) LTG: Pt will perform bed mobility with assistance level of: Independent with assistive device    Problem: RH Bed to Chair Transfers Goal: LTG Patient will perform bed/chair transfers w/assist (PT) Description: LTG: Patient will perform bed to chair transfers with assistance (PT). Flowsheets (Taken 07/13/2021 1210) LTG: Pt will perform Bed to Chair Transfers with assistance level: Independent with assistive device    Problem: RH Car Transfers Goal: LTG Patient will perform car transfers with assist (PT) Description: LTG: Patient will perform car transfers with assistance (PT). Flowsheets (Taken 07/13/2021 1210) LTG: Pt will perform car transfers with assist:: Contact Guard/Touching assist   Problem: RH Ambulation Goal:  LTG Patient will ambulate in home environment (PT) Description: LTG: Patient will ambulate in home environment, # of feet with assistance (PT). Flowsheets (Taken 07/13/2021 1210) LTG: Pt will ambulate in home environ  assist needed:: Contact Guard/Touching assist LTG: Ambulation distance in home environment: 10   Problem: RH Wheelchair Mobility Goal: LTG Patient will propel w/c in controlled environment (PT) Description: LTG: Patient will propel wheelchair in controlled environment, # of feet with assist (PT) Flowsheets (Taken 07/13/2021 1210) LTG: Pt will propel w/c in controlled environ  assist needed:: Independent with assistive device LTG: Propel w/c distance in controlled environment: 150 Goal: LTG Patient will propel w/c in home environment (PT) Description: LTG: Patient will propel wheelchair in home environment, # of feet with assistance (PT). Flowsheets (Taken 07/13/2021 1210) LTG: Pt will propel w/c in home environ  assist needed:: Supervision/Verbal cueing Distance: wheelchair distance in controlled environment: 150   Problem: RH Stairs Goal: LTG Patient will ambulate up and down stairs w/assist (PT) Description: LTG: Patient will ambulate up and down # of stairs with assistance (PT) Flowsheets (Taken 07/13/2021 1210) LTG: Pt will ambulate up/down stairs assist needed:: Minimal Assistance - Patient > 75% LTG: Pt will  ambulate up and down number of stairs: 5
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
## 2021-07-13 NOTE — Discharge Instructions (Addendum)
Inpatient Rehab Discharge Instructions  Cindy Valdez Discharge date and time: 07/23/21   Activities/Precautions/ Functional Status: Activity: no lifting, driving, or strenuous exercise for till cleared by MD Diet: regular diet Wound Care: Wash with soap and water, keep wound clean and dry. Apply dry dressing if drainage noted. Contact Dr. Jena Gauss if you develop any problems with your incision/wound--redness, swelling, increase in pain, drainage or if you develop fever or chills.     Functional status:  ___ No restrictions             ___ Walk up steps independently _X__ 24/7 supervision/assistance       ___ Walk up steps with assistance ___ Intermittent supervision/assistance  ___ Bathe/dress independently ___ Walk with walker     _X__ Bathe/dress with assistance ___ Walk Independently            ___ Shower independently ___ Walk with assistance             ___ Shower with assistance _X__ No alcohol              ___ Return to work/school ________   Special Instructions: Continue touch down weight bearing on right leg. Need assistance with walking--use wheelchair for mobility if alone.  You need to find primary care for routine follow up and for post hospital check in a couple weeks.   COMMUNITY REFERRALS UPON DISCHARGE:    Outpatient: PT     OT               Agency:[facility] Phone: [phone]              Appointment Date/Time: TBD  Medical Equipment/Items Ordered: Wheelchair, Agricultural consultant, Advertising copywriter, Bedside Commode                                                 Agency/Supplier: [employer]   My questions have been answered and I understand these instructions. I will adhere to these goals and the provided educational materials after my discharge from the hospital.  Patient/Caregiver Signature _______________________________ Date __________  Clinician Signature _______________________________________  Date __________  Please bring this form and your medication list with you to all your follow-up doctor's appointments.  Information on my medicine - ELIQUIS (apixaban)  This medication education was reviewed with me or my healthcare representative as part of my discharge preparation.    Why was Eliquis prescribed for you? Eliquis was prescribed to treat blood clots that may have been found in the veins of your legs (deep vein thrombosis) or in your lungs (pulmonary embolism) and to reduce the risk of them occurring again.  What do You need to know about Eliquis ? The starting dose is 10 mg (two 5 mg tablets) taken TWICE daily for the FIRST SEVEN (7) DAYS.  This was completed during your inpatient admission.  Please continue to take ONE 5 mg tablet taken TWICE daily for a total of 3 months.  Eliquis may be taken with or without food.   Try to take the dose about the same time in the morning and in the evening. If you have difficulty swallowing the tablet whole please discuss with your pharmacist how to take the medication safely.  Take Eliquis exactly as prescribed and DO NOT stop taking Eliquis  without talking to the doctor who prescribed the medication.  Stopping may increase your risk of developing a new blood clot.  Refill your prescription before you run out.  After discharge, you should have regular check-up appointments with your healthcare provider that is prescribing your Eliquis.    What do you do if you miss a dose? If a dose of ELIQUIS is not taken at the scheduled time, take it as soon as possible on the same day and twice-daily administration should be resumed. The dose should not be doubled to make up for a missed dose.  Important Safety Information A possible side effect of Eliquis is bleeding. You should call your healthcare provider right away if you experience any of the following: Bleeding from an injury or your nose that does not stop. Unusual colored urine (red  or dark brown) or unusual colored stools (red or black). Unusual bruising for unknown reasons. A serious fall or if you hit your head (even if there is no bleeding).  Some medicines may interact with Eliquis and might increase your risk of bleeding or clotting while on Eliquis. To help avoid this, consult your healthcare provider or pharmacist prior to using any new prescription or non-prescription medications, including herbals, vitamins, non-steroidal anti-inflammatory drugs (NSAIDs) and supplements.  This website has more information on Eliquis (apixaban): http://www.eliquis.com/eliquis/home

## 2021-07-13 NOTE — Progress Notes (Signed)
Inpatient Rehabilitation Center Individual Statement of Services  Patient Name:  Saydi Kobel  Date:  07/13/2021  Welcome to the Inpatient Rehabilitation Center.  Our goal is to provide you with an individualized program based on your diagnosis and situation, designed to meet your specific needs.  With this comprehensive rehabilitation program, you will be expected to participate in at least 3 hours of rehabilitation therapies Monday-Friday, with modified therapy programming on the weekends.  Your rehabilitation program will include the following services:  Physical Therapy (PT), Occupational Therapy (OT), Speech Therapy (ST), 24 hour per day rehabilitation nursing, Therapeutic Recreaction (TR), Neuropsychology, Care Coordinator, Rehabilitation Medicine, Nutrition Services, Pharmacy Services, and Other  Weekly team conferences will be held on Tuesdays to discuss your progress.  Your Inpatient Rehabilitation Care Coordinator will talk with you frequently to get your input and to update you on team discussions.  Team conferences with you and your family in attendance may also be held.  Expected length of stay:  8-21 Days  Overall anticipated outcome:  Min A  Depending on your progress and recovery, your program may change. Your Inpatient Rehabilitation Care Coordinator will coordinate services and will keep you informed of any changes. Your Inpatient Rehabilitation Care Coordinator's name and contact numbers are listed  below.  The following services may also be recommended but are not provided by the Inpatient Rehabilitation Center:   Home Health Rehabiltiation Services Outpatient Rehabilitation Services    Arrangements will be made to provide these services after discharge if needed.  Arrangements include referral to agencies that provide these services.  Your insurance has been verified to be:   Med Pay  Your primary doctor is:  NO PCP  Pertinent information will be shared with your  doctor and your insurance company.  Inpatient Rehabilitation Care Coordinator:  Lavera Guise, [place] [phone] or [phone]  Information discussed with and copy given to patient by: Andria Rhein, 07/13/2021, 10:27 AM

## 2021-07-14 DIAGNOSIS — D62 Acute posthemorrhagic anemia: Secondary | ICD-10-CM

## 2021-07-14 DIAGNOSIS — K5903 Drug induced constipation: Secondary | ICD-10-CM

## 2021-07-14 DIAGNOSIS — G8918 Other acute postprocedural pain: Secondary | ICD-10-CM

## 2021-07-14 DIAGNOSIS — Z96642 Presence of left artificial hip joint: Secondary | ICD-10-CM

## 2021-07-14 NOTE — Patient Care Conference (Signed)
Inpatient RehabilitationTeam Conference and Plan of Care Update Date: 07/14/2021   Time: 11:40 AM    Patient Name: Cindy Valdez      Medical Record Number: [id number]  Date of Birth: [birth year] Sex: Female         Room/Bed: 4W11C/4W11C-01 Payor Info: Payor: MED PAY / Plan: MED PAY ASSURANCE / Product Type: *No Product type* /    Admit Date/Time:  07/12/2021  2:52 PM  Primary Diagnosis:  Trauma  Hospital Problems: Principal Problem:   Trauma Active Problems:   H/O total hip arthroplasty    Expected Discharge Date: Expected Discharge Date: 07/23/21  Team Members Present: Physician leading conference: Dr. Maryla Morrow Social Worker Present: Lavera Guise, BSW Nurse Present: Kennyth Arnold, RN PT Present: Bernie Covey, PT OT Present: Earleen Newport, OT PPS Coordinator present : Fae Pippin, SLP     Current Status/Progress Goal Weekly Team Focus  Bowel/Bladder   Continent of Bladder and Bowel. LBM 07/11/21 per Patient.  To acheive  regular bowel Pattern.  timed toileting q 2hr   Swallow/Nutrition/ Hydration             ADL's   bed level LB dress/bathe Max/total with rolling L/R, rolling Min A, good recall of precautions, slide board Min/CGA, got bariatric drop arm  Supervision tranfers, CGA standing balance, Min A LB bathe/dress  slide board transfers, ADL transfers, sit <> stands, AE training, ADL retraining, pain management   Mobility   bed mobility supervision, CGA slideboard  mod I w/c level, supervision standing  transfers, standing   Communication             Safety/Cognition/ Behavioral Observations            Pain   Complains of Pain to Surgical site. Managed with oxycodone prn.  <3/10. Please  Assess Q Shift and PRN.  assess pain q 4hr and prn   Skin   Surgical Incision to Lt Hip and Rt Lower Limb.  Promote wound healing, and Prevent skin breakdown.  assess skin q shift and prn     Discharge Planning:      Team Discussion: Pain controlled,  constipation with dark stools, will check occult blood. Increased bowel medications. Continent B/B, LBM 9/17. Tylenol and Oxy IR for reported 6/10 pain. Incisions clean with appropriate dressings. Nursing educating on skin/wound care, pain, and medication management. Current barriers are pain and constipation. Discharging home with daughter. Slide board transfers progressing, good recall of precautions. Mod I goals. Contact guard transfer to [facility] with slide board. Contact guard/supervision goals.  Patient on target to meet rehab goals: yes  *See Care Plan and progress notes for long and short-term goals.   Revisions to Treatment Plan:  MD checking for occult stools, increased bowel medications.  Teaching Needs: Family education, medication management, pain management, constipation management, skin/wound care, transfer training, slide board training, weight bearing precautions, balance training, endurance training, safety awareness.  Current Barriers to Discharge: Decreased caregiver support, Medical stability, Home enviroment access/layout, Wound care, Lack of/limited family support, Weight bearing restrictions, and Medication compliance  Possible Resolutions to Barriers: Continue current medications for pain and constipation management, check stool for occult blood, continue education with family.     Medical Summary Current Status: Deficits with mobility, transfers, self-care secondary to [facility]  Barriers to Discharge: Medical stability;Weight;Weight bearing restrictions   Possible Resolutions to [employer] Focus: Therapies, optimize pain meds, follow labs - Hb, hemoccult stools, optimize bowel meds   Continued Need for Acute Rehabilitation Level of  Care: The patient requires daily medical management by a physician with specialized training in physical medicine and rehabilitation for the following reasons: Direction of a multidisciplinary physical rehabilitation program to  maximize functional independence : Yes Medical management of patient stability for increased activity during participation in an intensive rehabilitation regime.: Yes Analysis of laboratory values and/or radiology reports with any subsequent need for medication adjustment and/or medical intervention. : Yes   I attest that I was present, lead the team conference, and concur with the assessment and plan of the team.   Tennis Must 07/14/2021, 5:03 PM

## 2021-07-14 NOTE — Progress Notes (Signed)
Occupational Therapy Session Note  Patient Details  Name: Cindy Valdez MRN: [id number] Date of Birth: Mar 10, 1966  Today's Date: 07/14/2021 OT Individual Time: 0940-1030 OT Individual Time Calculation (min): 50 min  and Today's Date: 07/14/2021 OT Missed Time: 10 Minutes Missed Time Reason: Other (comment) (previous pt care)   Short Term Goals: Week 1:  OT Short Term Goal 1 (Week 1): patient will complete rolling in bed and supine to/from sitting with min A OT Short Term Goal 2 (Week 1): patient will complete functional transfer with CS/CGA OT Short Term Goal 3 (Week 1): patient will complete lower body dressing with min A using assistive devices OT Short Term Goal 4 (Week 1): patient will complete toileting with mod A  Skilled Therapeutic Interventions/Progress Updates:    Pt greeted at time of session 10 minutes late due to previous patient care, pt understanding. Missed 10 mins of OT. Pt in semireclined position, and at beginning of session removed standard BSC from room for additional space and located bariatric drop arm BSC and provided in room. Pt did not need to toilet at this time. LB dressing bed level in long sitting, attempted to thread LLE (pt states RLE moves better than LLE) but needed assist to thread both feet and bring up past knee level. Rolling L/R with Min A but needing assist to stay in side lying, attempting to use exposed UE to pull up pants but with difficulty, eventually needing Max/total A. Supine > sit CGA and sitting EOB performed UB bathing set up and donned shirt Supervision. Slide board total A for placement and CGA/Min for transfer to wheelchair. Set up at sink for oral hygiene and washing face. Pt agreeable to try to sit up in chair for an hour or two, instructed to call if becomes uncomfortable, safety plan is updated. Alarm on call bell in reach.   Therapy Documentation Precautions:  Precautions Precautions: Fall Restrictions Weight Bearing Restrictions:  Yes RLE Weight Bearing: Touchdown weight bearing LLE Weight Bearing: Weight bearing as tolerated Other Position/Activity Restrictions: left anterior hip precautions     Therapy/Group: Individual Therapy  Erasmo Score 07/14/2021, 7:23 AM

## 2021-07-14 NOTE — Progress Notes (Signed)
Physical Therapy Session Note  Patient Details  Name: Cindy Valdez MRN: [id number] Date of Birth: [birth year]  Today's Date: 07/14/2021 PT Individual Time: 0800-0900 PT Individual Time Calculation (min): 60 min   Short Term Goals: Week 1:  PT Short Term Goal 1 (Week 1): Patient will be able to complete bed mobility with MinA consistently PT Short Term Goal 2 (Week 1): Patient will complete bed <> wc transfer with LRAD and CGA consistently PT Short Term Goal 3 (Week 1): Patient will complete STS with LRAD and MinA consistently PT Short Term Goal 4 (Week 1): Patient will ambulate 44ft with LRAD and assist as needed  Skilled Therapeutic Interventions/Progress Updates:    Session 1: pt received in bed and agreeable to therapy. Pt reports 4/10 pain in BLE, addressed with rest and repositioning.  Pt reported need to have a bowel movement. No bariatric BSC available at this time, utilized 3 in 1. Bed mobility with min A to sit EOB. Pt then performed slideboard transfer to 3 in 1 with CGA. Cues for technique throughout, especially for hand placement and head hips relationship. Continent bowel and bladder void, bowel void noted to be black in color, MD aware. Donned/doffed brief with lateral leans and tot A. Pt was independent with anterior hygiene and required assist for posterior hygiene. Pt returned to bed in same manner as written above. Pt was left with all needs in reach and alarm active.   Session 2: Pt seated in w/c on arrival and agreeable to therapy. Pt c/o 5/10 pain in BLE, reports she was premedicated. Pt transported to therapy gym for time management and energy conservation. Session focused on transfer technique for eventual progression to squat pivot transfers. Pt demoed improved technique and hand placement during slideboard transfer with supervision w/c<>mat table>bed. Pt performed anterior weight shifts to promote LLE WB tolerance and LE strength with cues for head hips relationship and to  clear buttocks. Pt then performed lateral scoots on mat table in same manner, using anterior weight shift to use LE for transfer. Pt demoed improved technique and safety following interventions, and was able to complete transfer in less time. Pt returned to bed after session with min A to manage LLE and was left with all needs in reach and alarm active.   Therapy Documentation Precautions:  Precautions Precautions: Fall Restrictions Weight Bearing Restrictions: Yes RLE Weight Bearing: Touchdown weight bearing LLE Weight Bearing: Weight bearing as tolerated Other Position/Activity Restrictions: left anterior hip precautions    Therapy/Group: Individual Therapy  Juluis Rainier 07/14/2021, 3:58 PM

## 2021-07-14 NOTE — Progress Notes (Signed)
Occupational Therapy Session Note  Patient Details  Name: Cindy Valdez MRN: [id number] Date of Birth: [birth year]  Today's Date: 07/14/2021 OT Individual Time: 1305-1330 OT Individual Time Calculation (min): 25 min    Short Term Goals: Week 1:  OT Short Term Goal 1 (Week 1): patient will complete rolling in bed and supine to/from sitting with min A OT Short Term Goal 2 (Week 1): patient will complete functional transfer with CS/CGA OT Short Term Goal 3 (Week 1): patient will complete lower body dressing with min A using assistive devices OT Short Term Goal 4 (Week 1): patient will complete toileting with mod A  Skilled Therapeutic Interventions/Progress Updates:    OT intervention with focus on w/c mobility in hallway and community setting. Pt propelled w/c in hallways and level ground in courtyard, in addition to incline and decline. CGA for incline and decline, supervision for level hallway. Continued with discharge planning and education. Pt returned to room and remained in w/c with seat alarm activated. All needs within reach.   Therapy Documentation Precautions:  Precautions Precautions: Fall Restrictions Weight Bearing Restrictions: Yes RLE Weight Bearing: Touchdown weight bearing LLE Weight Bearing: Weight bearing as tolerated Other Position/Activity Restrictions: left anterior hip precautions   Pain: Pt c/o 4/10 BLE pain R>L; meds admin prior to therapy   Therapy/Group: Individual Therapy  Rich Brave 07/14/2021, 1:34 PM

## 2021-07-14 NOTE — Progress Notes (Deleted)
PROGRESS NOTE   Subjective/Complaints: Patient seen laying in bed this morning.  She states she slept well overnight.  She is working with therapies.  Discussed endurance as well as dark stools with therapies.  ROS: Denies CP, SOB, N/V/D  Objective:   No results found. No results for input(s): WBC, HGB, HCT, PLT in the last 72 hours. No results for input(s): NA, K, CL, CO2, GLUCOSE, BUN, CREATININE, CALCIUM in the last 72 hours.  Intake/Output Summary (Last 24 hours) at 07/14/2021 1139 Last data filed at 07/14/2021 0900 Gross per 24 hour  Intake 480 ml  Output 1150 ml  Net -670 ml         Physical Exam: Vital Signs Blood pressure 136/66, pulse 84, temperature 98.2 F (36.8 C), temperature source Oral, resp. rate 18, height 5\' 3"  (1.6 m), weight 130 kg, SpO2 98 %. Constitutional: No distress . Vital signs reviewed. HENT: Normocephalic.  Atraumatic. Eyes: EOMI. No discharge. Cardiovascular: No JVD.  RRR. Respiratory: Normal effort.  No stridor.  Bilateral clear to auscultation. GI: Non-distended.  BS +. Skin: Warm and dry.  Incision with sutures CDI. Psych: Normal mood.  Normal behavior. Musc: No edema in extremities.  No tenderness in extremities. Neurologic: Alert and oriented Motor: 5/5 in bilateral deltoid, bicep, tricep, grip Right lower extremity 4 -/5 proximal distal Left lower extremity: Hip flexion 2+/5, knee extension 3/5, ankle dorsiflexion 4/5  Assessment/Plan: 1. Functional deficits which require 3+ hours per day of interdisciplinary therapy in a comprehensive inpatient rehab setting. Physiatrist is providing close team supervision and 24 hour management of active medical problems listed below. Physiatrist and rehab team continue to assess barriers to discharge/monitor patient progress toward functional and medical goals  Care Tool:  Bathing    Body parts bathed by patient: Right arm, Left arm,  Chest, Abdomen, Front perineal area, Buttocks, Right upper leg, Left upper leg, Face   Body parts bathed by helper: Buttocks, Right lower leg, Left lower leg     Bathing assist Assist Level: Moderate Assistance - Patient 50 - 74%     Upper Body Dressing/Undressing Upper body dressing   What is the patient wearing?: Pull over shirt    Upper body assist Assist Level: Supervision/Verbal cueing    Lower Body Dressing/Undressing Lower body dressing      What is the patient wearing?: Pants     Lower body assist Assist for lower body dressing: Total Assistance - Patient < 25%     Toileting Toileting    Toileting assist Assist for toileting: Total Assistance - Patient < 25%     Transfers Chair/bed transfer  Transfers assist     Chair/bed transfer assist level: Minimal Assistance - Patient > 75% (slide board)     Locomotion Ambulation   Ambulation assist   Ambulation activity did not occur: Safety/medical concerns          Walk 10 feet activity   Assist  Walk 10 feet activity did not occur: Safety/medical concerns        Walk 50 feet activity   Assist Walk 50 feet with 2 turns activity did not occur: Safety/medical concerns  Walk 150 feet activity   Assist Walk 150 feet activity did not occur: Safety/medical concerns         Walk 10 feet on uneven surface  activity   Assist Walk 10 feet on uneven surfaces activity did not occur: Safety/medical concerns         Wheelchair     Assist Is the patient using a wheelchair?: Yes Type of Wheelchair: Manual    Wheelchair assist level: Supervision/Verbal cueing Max wheelchair distance: 50    Wheelchair 50 feet with 2 turns activity    Assist        Assist Level: Supervision/Verbal cueing   Wheelchair 150 feet activity     Assist      Assist Level: Moderate Assistance - Patient 50 - 74%   Blood pressure 136/66, pulse 84, temperature 98.2 F (36.8 C),  temperature source Oral, resp. rate 18, height 5\' 3"  (1.6 m), weight 130 kg, SpO2 98 %.  Medical Problem List and Plan: 1.  Deficits with mobility, transfers, self-care secondary to [facility]. Left FNF s/p ant THA WBAT Right tib fx s/p IM nail TDWB  Continue CIR  Team conference today to discuss current and goals and coordination of care, home and environmental barriers, and discharge planning with nursing, case manager, and therapies. Please see conference note from today as well.  2.  DVT prophylaxis/anticoagulation:   -Mechanical: Sequential compression devices, below knee Bilateral lower extremities Pharmaceutical: Other (comment): Eliquis             -antiplatelet therapy: N/A 3. Pain Management: Oxycodone as needed Controlled with meds on 9/20 Monitor with increased exertion 4.  Mood: Team support             -antipsychotic agents:N/A 5. Neuropsych: This patient is capable of making decisions on her own behalf. 6. Skin/Wound Care: Monitor surgical sites and abrasions. 7. Fluids/Electrolytes/Nutrition: Routine I/Os 8.  Morbid obesity: Encourage weight loss 9.  Acute blood loss anemia             Hemoglobin 8.5 on 9/14, labs ordered for tomorrow, given dark stools  Hemoccult ordered 10.  GERD             Continue Protonix 11.  Sleep apnea             CPAP not working and currently using supplemental oxygen at bedtime, continue 12.  Drug-induced constipation             Adjust bowel meds as necessary  Improving   LOS: 2 days A FACE TO FACE EVALUATION WAS PERFORMED  Cindy Valdez 10/14 07/14/2021, 11:39 AM

## 2021-07-14 NOTE — Progress Notes (Signed)
Team Conference Report to Patient/Family  Team Conference discussion was reviewed with the patient and caregiver, including goals, any changes in plan of care and target discharge date.  Patient and caregiver express understanding and are in agreement.  The patient has a target discharge date of 07/23/21.  SW met with patient, called spouse at bedside. Provided team conference updates. Pt reports pain has improved. No additional questions or concerns, sw will continue to follow up  Dyanne Iha 07/14/2021, 1:16 PM

## 2021-07-14 NOTE — Progress Notes (Signed)
PROGRESS NOTE   Subjective/Complaints: Patient seen laying in bed this morning.  She states she slept well overnight.  She is working with therapies.  Discussed endurance as well as dark stools with therapies.  ROS: Denies CP, SOB, N/V/D  Objective:   No results found. No results for input(s): WBC, HGB, HCT, PLT in the last 72 hours. No results for input(s): NA, K, CL, CO2, GLUCOSE, BUN, CREATININE, CALCIUM in the last 72 hours.  Intake/Output Summary (Last 24 hours) at 07/14/2021 1009 Last data filed at 07/14/2021 0900 Gross per 24 hour  Intake 480 ml  Output 1150 ml  Net -670 ml         Physical Exam: Vital Signs Blood pressure 136/66, pulse 84, temperature 98.2 F (36.8 C), temperature source Oral, resp. rate 18, height 5\' 3"  (1.6 m), weight 130 kg, SpO2 98 %. Constitutional: No distress . Vital signs reviewed. HENT: Normocephalic.  Atraumatic. Eyes: EOMI. No discharge. Cardiovascular: No JVD.  RRR. Respiratory: Normal effort.  No stridor.  Bilateral clear to auscultation. GI: Non-distended.  BS +. Skin: Warm and dry.  Incision with sutures CDI. Psych: Normal mood.  Normal behavior. Musc: No edema in extremities.  No tenderness in extremities. Neurologic: Alert and oriented Motor: 5/5 in bilateral deltoid, bicep, tricep, grip Right lower extremity 4 -/5 proximal distal Left lower extremity: Hip flexion 2+/5, knee extension 3/5, ankle dorsiflexion 4/5  Assessment/Plan: 1. Functional deficits which require 3+ hours per day of interdisciplinary therapy in a comprehensive inpatient rehab setting. Physiatrist is providing close team supervision and 24 hour management of active medical problems listed below. Physiatrist and rehab team continue to assess barriers to discharge/monitor patient progress toward functional and medical goals  Care Tool:  Bathing    Body parts bathed by patient: Right arm, Left arm,  Chest, Abdomen, Front perineal area, Buttocks, Right upper leg, Left upper leg, Face   Body parts bathed by helper: Buttocks, Right lower leg, Left lower leg     Bathing assist Assist Level: Moderate Assistance - Patient 50 - 74%     Upper Body Dressing/Undressing Upper body dressing   What is the patient wearing?: Pull over shirt    Upper body assist Assist Level: Supervision/Verbal cueing    Lower Body Dressing/Undressing Lower body dressing      What is the patient wearing?: Pants, Incontinence brief     Lower body assist Assist for lower body dressing: Total Assistance - Patient < 25%     Toileting Toileting    Toileting assist Assist for toileting: Total Assistance - Patient < 25%     Transfers Chair/bed transfer  Transfers assist     Chair/bed transfer assist level: Minimal Assistance - Patient > 75%     Locomotion Ambulation   Ambulation assist   Ambulation activity did not occur: Safety/medical concerns          Walk 10 feet activity   Assist  Walk 10 feet activity did not occur: Safety/medical concerns        Walk 50 feet activity   Assist Walk 50 feet with 2 turns activity did not occur: Safety/medical concerns  Walk 150 feet activity   Assist Walk 150 feet activity did not occur: Safety/medical concerns         Walk 10 feet on uneven surface  activity   Assist Walk 10 feet on uneven surfaces activity did not occur: Safety/medical concerns         Wheelchair     Assist Is the patient using a wheelchair?: Yes Type of Wheelchair: Manual    Wheelchair assist level: Supervision/Verbal cueing Max wheelchair distance: 50    Wheelchair 50 feet with 2 turns activity    Assist        Assist Level: Supervision/Verbal cueing   Wheelchair 150 feet activity     Assist      Assist Level: Moderate Assistance - Patient 50 - 74%   Blood pressure 136/66, pulse 84, temperature 98.2 F (36.8 C),  temperature source Oral, resp. rate 18, height 5\' 3"  (1.6 m), weight 130 kg, SpO2 98 %.  Medical Problem List and Plan: 1.  Deficits with mobility, transfers, self-care secondary to [facility]. Left FNF s/p ant THA WBAT Right tib fx s/p IM nail TDWB  Continue CIR  Team conference today to discuss current and goals and coordination of care, home and environmental barriers, and discharge planning with nursing, case manager, and therapies. Please see conference note from today as well.  2.  DVT prophylaxis/anticoagulation:   -Mechanical: Sequential compression devices, below knee Bilateral lower extremities Pharmaceutical: Other (comment): Eliquis             -antiplatelet therapy: N/A 3. Pain Management: Oxycodone as needed Controlled with meds on 9/20 Monitor with increased exertion 4.  Mood: Team support             -antipsychotic agents:N/A 5. Neuropsych: This patient is capable of making decisions on her own behalf. 6. Skin/Wound Care: Monitor surgical sites and abrasions. 7. Fluids/Electrolytes/Nutrition: Routine I/Os 8.  Morbid obesity: Encourage weight loss 9.  Acute blood loss anemia             Hemoglobin 8.5 on 9/14, labs ordered for tomorrow, given dark stools  Hemoccult ordered 10.  GERD             Continue Protonix 11.  Sleep apnea             CPAP not working and currently using supplemental oxygen at bedtime, continue 12.  Drug-induced constipation             Adjust bowel meds as necessary  Improving   LOS: 2 days A FACE TO FACE EVALUATION WAS PERFORMED  Cindy Valdez 10/14 07/14/2021, 10:09 AM

## 2021-07-15 LAB — CBC
HCT: 33.8 % — ABNORMAL LOW (ref 36.0–46.0)
Hemoglobin: 10.3 g/dL — ABNORMAL LOW (ref 12.0–15.0)
MCH: 28.8 pg (ref 26.0–34.0)
MCHC: 30.5 g/dL (ref 30.0–36.0)
MCV: 94.4 fL (ref 80.0–100.0)
Platelets: 452 10*3/uL — ABNORMAL HIGH (ref 150–400)
RBC: 3.58 MIL/uL — ABNORMAL LOW (ref 3.87–5.11)
RDW: 16 % — ABNORMAL HIGH (ref 11.5–15.5)
WBC: 6.8 10*3/uL (ref 4.0–10.5)
nRBC: 0 % (ref 0.0–0.2)

## 2021-07-15 NOTE — Progress Notes (Signed)
Orthopaedic Trauma Progress Note  SUBJECTIVE: Doing fairly well. Pain remains manageable. Working hard with therapies. Was able to get up on parallel bars today.  OBJECTIVE:  General: Sitting up in WC, NAD.  Respiratory: No increased work of breathing.  RLE: Incision over knee healing well. Traumatic wound to anterior tibia with mild serosanguinous drainage. Removed a total of 3 sutures from the proximal and distal portions but left all other sutures in place. Bruising and swelling through leg stable. Mildly tender with palption through calf. Able to wiggle toes. Endorses sensation to light touch throughout extremity.  Ankle DF/PF intact.  Compartments compressible.  IMAGING: Repeat imaging R tibia stable  ASSESSMENT: Cindy Valdez is a 55 y.o. female s/p REMOVAL EXTERNAL FIXATION AND INTRAMEDULLARY NAIL RIGHT TIBIA 06/24/21 by Dr. Jena Gauss LEFT TOTAL HIP ARTHOPLASTY 06/25/21 by Dr. Magnus Ivan  PLAN: Weightbearing: TDWB RLE Incisional and dressing care: Sutures remain in place over traumatic wound. Change dressing PRN. Ok to leave open to air if no drainage Showering: Ok to shower, RLE may get wet. Clean with soap and water. Do not submerge VTE prophylaxis: Eliquis for DVT treatment Impediments to Fracture Healing: Vit D level 58, no supplementation needed  Dispo: Continue with therapies. Will monitor RLE for possible suture removal later this week.   Follow - up plan: We will continue to follow while in hospital and plan for outpatient follow-up 2 weeks after d/c for repeat x-rays   Contact information:  Truitt Merle MD, Ulyses Southward PA-C. After hours and holidays please check [email or web address] for group call information for [facility]   Doria Fern A. Michaelyn Barter, PA-C [phone] (office) [email or web address]

## 2021-07-15 NOTE — Progress Notes (Signed)
Physical Therapy Session Note  Patient Details  Name: Cindy Valdez MRN: [id number] Date of Birth: 1965-11-06  Today's Date: 07/15/2021 PT Individual Time: 0800-0900 PT Individual Time Calculation (min): 60 min   Short Term Goals: Week 1:  PT Short Term Goal 1 (Week 1): Patient will be able to complete bed mobility with MinA consistently PT Short Term Goal 2 (Week 1): Patient will complete bed <> wc transfer with LRAD and CGA consistently PT Short Term Goal 3 (Week 1): Patient will complete STS with LRAD and MinA consistently PT Short Term Goal 4 (Week 1): Patient will ambulate 70ft with LRAD and assist as needed  Skilled Therapeutic Interventions/Progress Updates:    pt received in bed and agreeable to therapy. Pt reports 6/10 pain at rest that escalated to 8/10 with activity. Nsg administered pain medication during session. Pt requested to use the [facility]. slideboard transfer with supervision EOB<>BSC>w/c. VC for technique throughout. Clothing management with assist with lateral leans, dependent posterior hygiene, independent anterior hygiene. Donned pants with lateral leans and assist to thread pants and pull over hips. Pt transported to therapy gym for time management and energy conservation. Pt stood in // bars x 3 with min-mod A at times, cues for technique for progression to RW. Pt returned to room and remained in w/c at end of session, was left with all needs in reach and alarm active.   Therapy Documentation Precautions:  Precautions Precautions: Fall Restrictions Weight Bearing Restrictions: Yes RLE Weight Bearing: Touchdown weight bearing LLE Weight Bearing: Weight bearing as tolerated Other Position/Activity Restrictions: left anterior hip precautions     Therapy/Group: Individual Therapy  Juluis Rainier 07/15/2021, 12:55 PM

## 2021-07-15 NOTE — Progress Notes (Signed)
PROGRESS NOTE   Subjective/Complaints: Patient seen sitting up in her chair this morning.  She states she slept well overnight.  She is happy about the fact that she was able to stand in the parallel bars this morning.  She notes a BM this morning as well.  Discussed Hemoccult nursing.  ROS: Denies CP, SOB, N/V/D  Objective:   No results found. No results for input(s): WBC, HGB, HCT, PLT in the last 72 hours. No results for input(s): NA, K, CL, CO2, GLUCOSE, BUN, CREATININE, CALCIUM in the last 72 hours.  Intake/Output Summary (Last 24 hours) at 07/15/2021 1048 Last data filed at 07/15/2021 0753 Gross per 24 hour  Intake 550 ml  Output 475 ml  Net 75 ml         Physical Exam: Vital Signs Blood pressure (!) 137/58, pulse 76, temperature 98.4 F (36.9 C), temperature source Oral, resp. rate 18, height 5\' 3"  (1.6 m), weight 130 kg, SpO2 99 %. Constitutional: No distress . Vital signs reviewed. HENT: Normocephalic.  Atraumatic. Eyes: EOMI. No discharge. Cardiovascular: No JVD.  RRR. Respiratory: Normal effort.  No stridor.  Bilateral clear to auscultation. GI: Non-distended.  BS +. Skin: Warm and dry.  Incisions with sutures CDI. Psych: Normal mood.  Normal behavior. Musc: No edema in extremities.  No tenderness in extremities. Neurologic: Alert and oriented Motor: 5/5 in bilateral deltoid, bicep, tricep, grip Right lower extremity 4 -/5 proximal distal, unchanged Left lower extremity: Hip flexion 2+/5, knee extension 3/5, ankle dorsiflexion 4/5  Assessment/Plan: 1. Functional deficits which require 3+ hours per day of interdisciplinary therapy in a comprehensive inpatient rehab setting. Physiatrist is providing close team supervision and 24 hour management of active medical problems listed below. Physiatrist and rehab team continue to assess barriers to discharge/monitor patient progress toward functional and medical  goals  Care Tool:  Bathing    Body parts bathed by patient: Right arm, Left arm, Chest, Abdomen, Front perineal area, Buttocks, Right upper leg, Left upper leg, Face   Body parts bathed by helper: Buttocks, Right lower leg, Left lower leg     Bathing assist Assist Level: Moderate Assistance - Patient 50 - 74%     Upper Body Dressing/Undressing Upper body dressing   What is the patient wearing?: Pull over shirt    Upper body assist Assist Level: Supervision/Verbal cueing    Lower Body Dressing/Undressing Lower body dressing      What is the patient wearing?: Pants     Lower body assist Assist for lower body dressing: Total Assistance - Patient < 25%     Toileting Toileting    Toileting assist Assist for toileting: Maximal Assistance - Patient 25 - 49%     Transfers Chair/bed transfer  Transfers assist     Chair/bed transfer assist level: Supervision/Verbal cueing (slideboard)     Locomotion Ambulation   Ambulation assist   Ambulation activity did not occur: Safety/medical concerns          Walk 10 feet activity   Assist  Walk 10 feet activity did not occur: Safety/medical concerns        Walk 50 feet activity   Assist Walk 50 feet  with 2 turns activity did not occur: Safety/medical concerns         Walk 150 feet activity   Assist Walk 150 feet activity did not occur: Safety/medical concerns         Walk 10 feet on uneven surface  activity   Assist Walk 10 feet on uneven surfaces activity did not occur: Safety/medical concerns         Wheelchair     Assist Is the patient using a wheelchair?: Yes Type of Wheelchair: Manual    Wheelchair assist level: Supervision/Verbal cueing Max wheelchair distance: 50    Wheelchair 50 feet with 2 turns activity    Assist        Assist Level: Supervision/Verbal cueing   Wheelchair 150 feet activity     Assist      Assist Level: Moderate Assistance - Patient 50  - 74%   Blood pressure (!) 137/58, pulse 76, temperature 98.4 F (36.9 C), temperature source Oral, resp. rate 18, height 5\' 3"  (1.6 m), weight 130 kg, SpO2 99 %.  Medical Problem List and Plan: 1.  Deficits with mobility, transfers, self-care secondary to [employer]. Left FNF s/p ant THA WBAT Right tib fx s/p IM nail TDWB  Continue CIR 2.  DVT prophylaxis/anticoagulation:   -Mechanical: Sequential compression devices, below knee Bilateral lower extremities Pharmaceutical: Other (comment): Eliquis             -antiplatelet therapy: N/A 3. Pain Management: Oxycodone as needed Controlled with meds on 9/21 Monitor with increased exertion 4.  Mood: Team support             -antipsychotic agents:N/A 5. Neuropsych: This patient is capable of making decisions on her own behalf. 6. Skin/Wound Care: Monitor surgical sites and abrasions. 7. Fluids/Electrolytes/Nutrition: Routine I/Os 8.  Morbid obesity: Encourage weight loss 9.  Acute blood loss anemia             Hemoglobin 8.5 on 9/14, labs pending  Hemoccult ordered 10.  GERD             Continue Protonix 11.  Sleep apnea             CPAP not working and currently using supplemental oxygen at bedtime, continue 12.  Drug-induced constipation             Adjust bowel meds as necessary  Improving  LOS: 3 days A FACE TO FACE EVALUATION WAS PERFORMED  Arvetta Araque 10/14 07/15/2021, 10:48 AM

## 2021-07-15 NOTE — Progress Notes (Signed)
Occupational Therapy Session Note  Patient Details  Name: Cindy Valdez MRN: [id number] Date of Birth: 19-Jul-1966  Today's Date: 07/15/2021 OT Individual Time: 4827-0786 OT Individual Time Calculation (min): 54 min    Short Term Goals: Week 1:  OT Short Term Goal 1 (Week 1): patient will complete rolling in bed and supine to/from sitting with min A OT Short Term Goal 2 (Week 1): patient will complete functional transfer with CS/CGA OT Short Term Goal 3 (Week 1): patient will complete lower body dressing with min A using assistive devices OT Short Term Goal 4 (Week 1): patient will complete toileting with mod A   Skilled Therapeutic Interventions/Progress Updates:    Pt greeted at time of session sitting up in wheelchair agreeable to OT session, having pain in back and RLE, nursing aware and present at beginning of session for pain medication pass. Dietary present as well taking orders and NT for vitals at beginning of session. Agreeable to practice BSC transfers and clothing management, slide board wheelchair > bed <> bariatric DABSC with CGA initially and only needing Min A back to bed after BSC as the pt's pants got hung on slide board. Problem solved how to stabilize board to prevent this in future. On BSC, focused on clothing management techniques for lateral leans in stead of having to use pillow case over board, able to lean with Min/CGA for therapist to manage clothing down past hips, simulated toileting tasks. Attempted sit <> stand at back of reclining chair while maintaining WBAT on LLE and TDWB on RLE, able to come up to partial stand but unable to fully come up to stand. Note pt needing multiple rest breaks throughout but able to verbalize when needing a break. Sit > supine and remainder of session focused on LLE as pt states this leg has more stiffness > RLE. 1x10 of the following: single leg raises with overpressure, hip abduction/adduction, knee slides for flexion/extension. Pt in bed  resting alarm on call bell in reach.   Therapy Documentation Precautions:  Precautions Precautions: Fall Restrictions Weight Bearing Restrictions: Yes RLE Weight Bearing: Touchdown weight bearing LLE Weight Bearing: Weight bearing as tolerated Other Position/Activity Restrictions: left anterior hip precautions     Therapy/Group: Individual Therapy  Erasmo Score 07/15/2021, 7:12 AM

## 2021-07-15 NOTE — Progress Notes (Addendum)
Physical Therapy Session Note  Patient Details  Name: Melenie Minniear MRN: [id number] Date of Birth: May 01, 1966  Today's Date: 07/15/2021 PT Individual Time: 1051-1210 PT Individual Time Calculation (min): 79 min   Short Term Goals: Week 1:  PT Short Term Goal 1 (Week 1): Patient will be able to complete bed mobility with MinA consistently PT Short Term Goal 2 (Week 1): Patient will complete bed <> wc transfer with LRAD and CGA consistently PT Short Term Goal 3 (Week 1): Patient will complete STS with LRAD and MinA consistently PT Short Term Goal 4 (Week 1): Patient will ambulate 91f with LRAD and assist as needed  Skilled Therapeutic Interventions/Progress Updates: Pt presented in w/c agreeable to therapy. Pt c/o mild pain in BLE but pre-medicated and no additional intervention requested. Rest breaks provided throughout session as needed. PTA discussed with pt progression of therapies and provided support as needed as pt very pleased with this am's previous session. Pt then transported to rehab gym for energy conservation and performed 2 bouts of standing in parallel bars. Pt was CGA for Sit to stand and was able to tolerate standing ~24m each bout. During standing trials pt performed hip abd/add, knee flexion, hip flexion/extension 5-8 reps on RLE. Pt then transferred to mat and performed SB transfer to mat with PTA provided total A for set up and pt able to perform SB transfer with CGA and verbal cues for improved head/hips relationship. Pt then performed LAQ x 10 bilaterally and performed sit to supine transfer with use of gait belt as leg lifter with supervision and increased time. Pt then performed hip abd/add with LLE, AA heel slides, AA SLR x 10 and PTA performed hamstring stretch ` min x 2. On RLE pt performed SLR x 10 and heel cord stretch with use of gait belt 1 min x 2. Pt returned to sitting with supervision and performed SB transfer back to w/c in same manner as prior. Pt transported back  to room at end of session and left with belt alarm on, call bell within reach and needs met.      Therapy Documentation Precautions:  Precautions Precautions: Fall Restrictions Weight Bearing Restrictions: Yes RLE Weight Bearing: Touchdown weight bearing LLE Weight Bearing: Weight bearing as tolerated Other Position/Activity Restrictions: left anterior hip precautions General:   Vital Signs: Therapy Vitals Temp: 98.3 F (36.8 C) Temp Source: Oral Pulse Rate: 82 Resp: 18 BP: 129/65 Patient Position (if appropriate): Sitting Oxygen Therapy SpO2: 100 % O2 Device: Room Air Pain: Pain Assessment Pain Scale: 0-10 Pain Score: 4  Pain Type: Acute pain Pain Location: Leg Pain Orientation: Right Pain Descriptors / Indicators: Aching Pain Frequency: Constant Pain Onset: On-going Pain Intervention(s): Medication (See eMAR) Mobility:   Locomotion :    Trunk/Postural Assessment :    Balance:   Exercises:   Other Treatments:      Therapy/Group: Individual Therapy  Patrick Sohm 07/15/2021, 4:12 PM

## 2021-07-16 ENCOUNTER — Other Ambulatory Visit (HOSPITAL_COMMUNITY): Payer: Self-pay

## 2021-07-16 NOTE — Progress Notes (Signed)
Occupational Therapy Session Note  Patient Details  Name: Pyper Olexa MRN: [id number] Date of Birth: [birth year]  Today's Date: 07/17/2021 OT Individual Time: 3005-1102 OT Individual Time Calculation (min): 73 min   Short Term Goals: Week 1:  OT Short Term Goal 1 (Week 1): patient will complete rolling in bed and supine to/from sitting with min A OT Short Term Goal 2 (Week 1): patient will complete functional transfer with CS/CGA OT Short Term Goal 3 (Week 1): patient will complete lower body dressing with min A using assistive devices OT Short Term Goal 4 (Week 1): patient will complete toileting with mod A  Skilled Therapeutic Interventions/Progress Updates:    Pt greeted in the w/c, ADL needs met. We began session with pt brushing her teeth while sitting in front of the sink with setup assist. Practiced toilet transfers next using standard toilet and also the elevated toilet. Mod A for sit<stands and CGA for pivots using RW. Vcs for maximizing her stability in stance while adhering to WB precautions. Pt decided that she preferred the transfer using elevated toilet due to increased ease of sit<stand. Notified nursing of change in toilet transfer status on safety plan. We washed her hair using the hair washing tray at the sink and then pt brushed her hair with setup assist. Max A for slideboard placement and then close supervision for lateral scoot back to bed. Pt transitioned to supine with close supervision! She remained in bed at close of session, all needs within reach and bed alarm set. Tx focus placed on pt education, functional transfers, and dynamic balance.   Therapy Documentation Precautions:  Precautions Precautions: Fall Restrictions Weight Bearing Restrictions: Yes RLE Weight Bearing: Touchdown weight bearing LLE Weight Bearing: Non weight bearing Other Position/Activity Restrictions: left anterior hip precautions  Pain: in the Lt hip, ice pack provided at close of session to  address Pain Assessment Pain Scale: 0-10 Pain Score: 7  Pain Type: Acute pain Pain Location: Leg Pain Orientation: Right Pain Radiating Towards: burning left leg Pain Descriptors / Indicators: Aching;Burning;Discomfort Pain Frequency: Constant Pain Onset: On-going Pain Intervention(s): Medication (See eMAR) ADL: ADL Eating: Set up Where Assessed-Eating: Bed level Grooming: Setup, Supervision/safety Where Assessed-Grooming: Sitting at sink Upper Body Bathing: Setup, Supervision/safety Where Assessed-Upper Body Bathing: Sitting at sink Lower Body Bathing: Maximal assistance Where Assessed-Lower Body Bathing: Bed level Upper Body Dressing: Setup, Supervision/safety Where Assessed-Upper Body Dressing: Sitting at sink Lower Body Dressing: Maximal assistance, Dependent Where Assessed-Lower Body Dressing: Bed level Toileting: Maximal assistance, Dependent Where Assessed-Toileting: Bed level     Therapy/Group: Individual Therapy  Zalan Shidler A Treylen Gibbs 07/17/2021, 3:43 PM

## 2021-07-16 NOTE — Progress Notes (Signed)
Occupational Therapy Session Note  Patient Details  Name: Cindy Valdez MRN: [id number] Date of Birth: 12-25-1965  Today's Date: 07/16/2021 OT Individual Time: 1444-5848 OT Individual Time Calculation (min): 27 min    Short Term Goals: Week 1:  OT Short Term Goal 1 (Week 1): patient will complete rolling in bed and supine to/from sitting with min A OT Short Term Goal 2 (Week 1): patient will complete functional transfer with CS/CGA OT Short Term Goal 3 (Week 1): patient will complete lower body dressing with min A using assistive devices OT Short Term Goal 4 (Week 1): patient will complete toileting with mod A   Skilled Therapeutic Interventions/Progress Updates:  Patient met seated in wc in agreement with OT treatment session. Mild pain reported in L thigh at rest and with activity. Wc mobility to dayroom without external assist. Patient demonstrates good safety awareness with locking wc breaks. Able to position slideboard in prep for transfer to mat table. Patient completed BUE HEP with 3lb weighted dowel for 1 set x10 reps each. Handoff to PTA for session in rehab gym.   Therapy Documentation Precautions:  Precautions Precautions: Fall Restrictions Weight Bearing Restrictions: Yes RLE Weight Bearing: Touchdown weight bearing LLE Weight Bearing: Non weight bearing Other Position/Activity Restrictions: left anterior hip precautions General:   Vital Signs: Therapy Vitals Temp: 98.2 F (36.8 C) Pulse Rate: 79 Resp: 18 BP: 134/64 Patient Position (if appropriate): Lying Oxygen Therapy SpO2: 100 % O2 Device: Room Air Pain:    Therapy/Group: Individual Therapy  Cindy Valdez 07/16/2021, 6:53 AM

## 2021-07-16 NOTE — Progress Notes (Signed)
Physical Therapy Session Note  Patient Details  Name: Cindy Valdez MRN: [id number] Date of Birth: [birth year]  Today's Date: 07/16/2021 PT Individual Time: 0990-6893 PT Individual Time Calculation (min): 70 min   Short Term Goals: Week 1:  PT Short Term Goal 1 (Week 1): Patient will be able to complete bed mobility with MinA consistently PT Short Term Goal 2 (Week 1): Patient will complete bed <> wc transfer with LRAD and CGA consistently PT Short Term Goal 3 (Week 1): Patient will complete STS with LRAD and MinA consistently PT Short Term Goal 4 (Week 1): Patient will ambulate 46f with LRAD and assist as needed  Skilled Therapeutic Interventions/Progress Updates: Pt presented in rehab gym hand off from OT agreeable to therapy. Pt states pain 3-4/10, premedicated and no intervention needed but rest breaks provided as needed. Pt participated in seated and supine therex including LAQ with 1.5lb cuff on LLE and AROM x 10, supine heel slides x 10 bilaterally, SAQ 2 x 10 with 1.5 cuff on LLE, hip abd/ 2 x10 bilaterally, and AA SLR x 10 bilaterally. Pt also participated in Sit to stand from elevated mat with pt initially pulling self up with RW, then PTA instructing pt to use 1 UE to push up from stable surface. Pt was able to pull up with CGA but required modA when pushing up using RUE from mat. Pt able to stand a total of 3 times. Discussed with pt best practice pushing up from stable surface and with armrests tends to be a little easier. Pt verbalized understanding. Pt then performed Slide board transfer to w/c with pt requiring assist for Slide board set up however CGA for actual transfer with verbal cues for head hips relationship. Pt transported back to room for time management and PTA provided fresh ice pack with pt explaining to pt to check skin after approx 20 min (pt had ice pack on earlier in am) for reinforcement. Pt left in w/c at end of session with seat alarm on, call bell within reach and  needs met.       Therapy Documentation Precautions:  Precautions Precautions: Fall Restrictions Weight Bearing Restrictions: Yes RLE Weight Bearing: Touchdown weight bearing LLE Weight Bearing: Non weight bearing Other Position/Activity Restrictions: left anterior hip precautions General:   Vital Signs: Therapy Vitals Temp: 98.3 F (36.8 C) Temp Source: Oral Pulse Rate: 94 Resp: 18 BP: 121/62 Patient Position (if appropriate): Sitting Oxygen Therapy SpO2: 99 % O2 Device: Room Air Pain: Pain Assessment Pain Scale: 0-10 Pain Score: 6  Pain Type: Acute pain Pain Location: Leg Pain Orientation: Right Pain Descriptors / Indicators: Aching Pain Frequency: Constant Pain Onset: On-going Pain Intervention(s): Medication (See eMAR);Cold applied    Therapy/Group: Individual Therapy  Cindy Valdez 07/16/2021, 2:31 PM

## 2021-07-16 NOTE — TOC Benefit Eligibility Note (Signed)
Patient Advocate Encounter  Insurance verification completed.    The patient is uninsured  Tracina Beaumont, CPhT Pharmacy Patient Advocate Specialist [facility] Direct Number: [phone]  Fax: [phone]        

## 2021-07-16 NOTE — Progress Notes (Signed)
Occupational Therapy Session Note  Patient Details  Name: Cindy Valdez MRN: [id number] Date of Birth: Nov 30, 1965  Today's Date: 07/16/2021 OT Individual Time: 0900-1000 OT Individual Time Calculation (min): 60 min    Short Term Goals: Week 1:  OT Short Term Goal 1 (Week 1): patient will complete rolling in bed and supine to/from sitting with min A OT Short Term Goal 2 (Week 1): patient will complete functional transfer with CS/CGA OT Short Term Goal 3 (Week 1): patient will complete lower body dressing with min A using assistive devices OT Short Term Goal 4 (Week 1): patient will complete toileting with mod A  Skilled Therapeutic Interventions/Progress Updates:    Patient in bed, alert and ready for therapy session.  Pain in left thigh - she describes as "on fire" with change of position.  Pain relieved with light PROM of distal leg and ice pack.   Supine to sitting edge of bed with CS using bed rails.  SB transfer to drop arm commode with CGA and holding SB in place.  She is able to pull pants/brief down with mod A, hygiene with mod A, pants up max A.  Reviewed and practiced with toilet paper aide which was effective.  SB transfer to w/c CGA with increased time due to left thigh discomfort.  She completed grooming and oral care w/c level mod I.  Reviewed edema management and relieving pressure points with good understanding demonstrated.  She remained seated in w/c at close of session, legs elevated, ice pack and tray table/call bell in reach.    Therapy Documentation Precautions:  Precautions Precautions: Fall Restrictions Weight Bearing Restrictions: Yes RLE Weight Bearing: Touchdown weight bearing LLE Weight Bearing: Non weight bearing Other Position/Activity Restrictions: left anterior hip precautions  Therapy/Group: Individual Therapy  Barrie Lyme 07/16/2021, 7:41 AM

## 2021-07-16 NOTE — Progress Notes (Signed)
Physical Therapy Session Note  Patient Details  Name: Cindy Valdez MRN: [id number] Date of Birth: [birth year]  Today's Date: 07/16/2021 PT Individual Time: 1300-1415 PT Individual Time Calculation (min): 75 min   Short Term Goals: Week 1:  PT Short Term Goal 1 (Week 1): Patient will be able to complete bed mobility with MinA consistently PT Short Term Goal 2 (Week 1): Patient will complete bed <> wc transfer with LRAD and CGA consistently PT Short Term Goal 3 (Week 1): Patient will complete STS with LRAD and MinA consistently PT Short Term Goal 4 (Week 1): Patient will ambulate 1ft with LRAD and assist as needed  Skilled Therapeutic Interventions/Progress Updates:    Pt seated in w/c on arrival and agreeable to therapy. Pt reports muscle soreness in her LLE and UE today, and baseline levels of surgical pain, unrated. Addressed with mobility and rest breaks PRN. Pt transported to therapy gym for time management and energy conservation. Pt performed Stand pivot transfer x 2 with min A fading to CGA and RW. Demoed scooting technique on LLE, required VC for technique with new skill. Pt demoed considerable fatigue with each repetition and required extended seated rest break. Pt then transported to outdoor environment for mental health benefits of being outdoors. Pt performed the following exercises to promote LE strength and endurance: LAQ, seated heel toe raises, marches, 2 x 20 each. Pt had difficulty with motion in R ankle, reporting that it feels "frozen" compared to LLE. Pt also reported improvements in LLE strength and control since beginning weightbearing exercise. Pt then propelled w/c x 200 ft over uneven surfaces for endurance and preparation for community reintegration. Pt returned to room and remained in w/c, was left with all needs in reach and alarm active.   Therapy Documentation Precautions:  Precautions Precautions: Fall Restrictions Weight Bearing Restrictions: Yes RLE Weight  Bearing: Touchdown weight bearing LLE Weight Bearing: Non weight bearing Other Position/Activity Restrictions: left anterior hip precautions    Therapy/Group: Individual Therapy  Juluis Rainier 07/16/2021, 3:44 PM

## 2021-07-16 NOTE — Progress Notes (Signed)
Attempting to collect occult stool but urine mixed with BM, placed new hat in room.

## 2021-07-17 LAB — OCCULT BLOOD X 1 CARD TO LAB, STOOL: Fecal Occult Bld: POSITIVE — AB

## 2021-07-17 MED ORDER — GABAPENTIN 100 MG PO CAPS
100.0000 mg | ORAL_CAPSULE | Freq: Three times a day (TID) | ORAL | Status: DC
  Administered 2021-07-17 – 2021-07-23 (×18): 100 mg via ORAL
  Filled 2021-07-17 (×19): qty 1

## 2021-07-17 NOTE — Progress Notes (Signed)
Physical Therapy Session Note  Patient Details  Name: Cindy Valdez MRN: [id number] Date of Birth: [birth year]  Today's Date: 07/17/2021 PT Individual Time: 1135-1205 PT Individual Time Calculation (min): 30 min   Short Term Goals: Week 1:  PT Short Term Goal 1 (Week 1): Patient will be able to complete bed mobility with MinA consistently PT Short Term Goal 2 (Week 1): Patient will complete bed <> wc transfer with LRAD and CGA consistently PT Short Term Goal 3 (Week 1): Patient will complete STS with LRAD and MinA consistently PT Short Term Goal 4 (Week 1): Patient will ambulate 75ft with LRAD and assist as needed  Skilled Therapeutic Interventions/Progress Updates:  Patient seated upright in w/c with BLE elevated on entrance to room. Patient alert and agreeable to PT session.   Patient with low pain complaint at start of session but does relate expected increase in pain with WB especially with burning pain in L thigh with muscle activation in standing. Related to pt that most likely related to swelling since pain is felt more with change in position to standing and not while using musculature while seated.   Therapeutic Activity: Transfers: Patient performed sit<>stand w/c to RW several times with Min A and improving to close supervision. Provided minimal vc for technique.   Guided in seated marches and LAQs with focus on AAROM placement into max range and holds as well as slow eccentric returns with coordinated breathing. All to decrease pt's tendency to hold breath as well as to increase strength and joint mobility. Required vc/tc throughout for coordinating breathing.   Neuromuscular Re-ed: NMR facilitated during session with focus on increased muscle activation and functional mobility duing standing balance. Pt guided in standing balance task of bending R knee and ankle for forward heel touch and posterior toe touch as well as lateral movement foot touches while standing in walker. Pt  distracted with tasks and able to maintain standing for 4 minutes at longest bout. NMR performed for improvements in motor control and coordination, balance, sequencing, judgement, and self confiden ce/ efficacy in performing all aspects of mobility at highest level of independence.   Patient seated  in w/c at end of session with brakes locked, BLE elevated with RLE on pillow, belt alarm set, and all needs within reach.     Therapy Documentation Precautions:  Precautions Precautions: Fall Restrictions Weight Bearing Restrictions: Yes RLE Weight Bearing: Touchdown weight bearing LLE Weight Bearing: Non weight bearing Other Position/Activity Restrictions: left anterior hip precautions    Pain: Pain Assessment Pain Scale: 0-10 Pain Score: 5  Pain Type: Acute pain Pain Location: Leg Pain Orientation: Right Pain Radiating Towards: burning left leg Pain Descriptors / Indicators: Aching;Discomfort;Burning Pain Frequency: Constant Pain Onset: On-going Pain Intervention(s): Medication (See eMAR)  Therapy/Group: Individual Therapy  Loel Dubonnet PT, DPT 07/17/2021, 11:29 AM

## 2021-07-17 NOTE — Progress Notes (Signed)
Patient ID: Cindy Valdez, female   DOB: December 19, 1965, 55 y.o.   MRN: [id number] I removed the sutures from the patient's left knee and left hip today.  The hip incision has some breakdown toward the superior aspect due to the trauma to her soft tissue and her obesity.  Looks favorable though.  It does need  xeroform and a meplex dressing changed every other day while she is here.

## 2021-07-17 NOTE — Progress Notes (Signed)
Orthopaedic Trauma Progress Note  SUBJECTIVE: Doing fairly well. Continues to be motivated to work with therapies.  OBJECTIVE:  General: Sitting up in WC, NAD.  Respiratory: No increased work of breathing.  RLE: Incision over knee healing well. Remaining sutures removed from traumatic wound to anterior tibia as well as distal tibia incisions. Wound edges well approximated. All areas reinforced with steri-strips. No significant tenderness throughout lower leg. Able to wiggle toes. Endorses sensation to light touch throughout extremity.  Ankle DF/PF intact.  Compartments compressible.  IMAGING: Repeat imaging R tibia from 07/08/21 stable  ASSESSMENT: Cindy Valdez is a 55 y.o. female s/p REMOVAL EXTERNAL FIXATION AND INTRAMEDULLARY NAIL RIGHT TIBIA 06/24/21 by Dr. Jena Gauss LEFT TOTAL HIP ARTHOPLASTY 06/25/21 by Dr. Magnus Ivan  PLAN: Weightbearing: TDWB RLE Incisional and dressing care: All sutures have been removed. Ok to leave open to air if no drainage Showering: Ok to shower, RLE may get wet. Clean with soap and water. Do not submerge VTE prophylaxis: Eliquis for DVT treatment Impediments to Fracture Healing: Vit D level 58, no supplementation needed  Dispo: Continue with therapies. Please call if any issues noted with RLE traumatic wound.   Follow - up plan: We will continue to follow while in hospital and plan for outpatient follow-up 2 weeks after d/c for repeat x-rays   Contact information:  Truitt Merle MD, Ulyses Southward PA-C. After hours and holidays please check [email or web address] for group call information for [facility]   Karyl Sharrar A. Michaelyn Barter, PA-C [phone] (office) [email or web address]

## 2021-07-17 NOTE — Progress Notes (Signed)
Physical Therapy Session Note  Patient Details  Name: Cindy Valdez MRN: [id number] Date of Birth: May 12, 1966  Today's Date: 07/17/2021 PT Individual Time: 4401-0272 PT Individual Time Calculation (min): 71 min   Short Term Goals: Week 1:  PT Short Term Goal 1 (Week 1): Patient will be able to complete bed mobility with MinA consistently PT Short Term Goal 2 (Week 1): Patient will complete bed <> wc transfer with LRAD and CGA consistently PT Short Term Goal 3 (Week 1): Patient will complete STS with LRAD and MinA consistently PT Short Term Goal 4 (Week 1): Patient will ambulate 81ft with LRAD and assist as needed  Skilled Therapeutic Interventions/Progress Updates: Pt presents sitting in w/c and agreeable to therapy.  Pt states pain of 4/10 to distal RLE and has received pain meds.  Pt wheeled to main gym for time conservation.  Pt performed seated and standing there ex for increased strength.  Pt performed calf raises and LAQ 3 x 15 w/ 1.5# weight to LLE.  Pt also eventually performed standing R knee flexion 3 x 5-8 reps before c/o LLE burning pain.  Pt performed multiple sit to stand transfers w/ min A and education on hand placement, although states "does not feel comfortable" pushing w/ Ues from seated surface.  Pt performs w/ B Ues on RW and rocking.  Pt maintains NWB to RLE, stating anxiety about putting too much weight.  Pt transfers w/c <> mat table w/ RW and min A doing twist/pivot.  Discussed using UEs more, but unable to clear LLE from ground.  Pt performed seated reaching forward and side to side for cones, maintaining hip precautions.  Pt lifting T-ball overhead w/ feet on Airex pad.  Pt returned to room and remained sitting in w/c w/ seat alarm on and all needs in reach.  Pt very anxious about precautions, WB and so time taken for education and calming.     Therapy Documentation Precautions:  Precautions Precautions: Fall Restrictions Weight Bearing Restrictions: Yes RLE Weight  Bearing: Touchdown weight bearing LLE Weight Bearing: Non weight bearing Other Position/Activity Restrictions: left anterior hip precautions General:   Vital Signs:   Pain:4/10 pre-rx, 5-6/10 post-rx w/ pain meds already given. Pain Assessment Pain Scale: 0-10 Pain Score: 5  Pain Type: Acute pain Pain Location: Leg Pain Orientation: Right Pain Radiating Towards: burning left leg Pain Descriptors / Indicators: Aching;Discomfort;Burning Pain Frequency: Constant Pain Onset: On-going Pain Intervention(s): Medication (See eMAR)    Therapy/Group: Individual Therapy  Lucio Edward 07/17/2021, 11:08 AM

## 2021-07-17 NOTE — Progress Notes (Signed)
Physical Therapy Session Note  Patient Details  Name: Cindy Valdez MRN: [id number] Date of Birth: [birth year]  Today's Date: 07/17/2021 PT Individual Time: 4034-7425 PT Individual Time Calculation (min): 30 min   Short Term Goals: Week 1:  PT Short Term Goal 1 (Week 1): Patient will be able to complete bed mobility with MinA consistently PT Short Term Goal 2 (Week 1): Patient will complete bed <> wc transfer with LRAD and CGA consistently PT Short Term Goal 3 (Week 1): Patient will complete STS with LRAD and MinA consistently PT Short Term Goal 4 (Week 1): Patient will ambulate 51ft with LRAD and assist as needed  Skilled Therapeutic Interventions/Progress Updates:  Pain - pt denies pain this am, reports discomfort due to need for BM.  Sess below.    Pt initially supine, requesting assist, needs to have BM.  Nursing reports needing stool sample which was difficulty to obtain on BSC.  Pt supine to sit w/bedrails, additional time, supervision. Bed to [facility] w/level conditions w/set up and cga, maintains wbing precs well. Transported to BR. Wc to [facility] over commode w/collection hat in place for sample collection/  w/uphill bias, reliance on grab bars, set up and min assist, additional time.  Brief removed w/mod assist.   Pt continent of Bowel and bladder.  Nursing notified and agreed to assist pt from this point as PT session timed out. Reviewed assist needed w/nurse and NT.  Pt notified to use call bell.  Therapy Documentation Precautions:  Precautions Precautions: Fall Restrictions Weight Bearing Restrictions: Yes RLE Weight Bearing: Touchdown weight bearing LLE Weight Bearing: Non weight bearing Other Position/Activity Restrictions: left anterior hip precautions General:   Vital Signs:   Pain: Pain Assessment Pain Scale: 0-10 Pain Score: 7  Pain Type: Acute pain Pain Location: Leg Pain Orientation: Right Pain Radiating Towards: burning left leg Pain Descriptors / Indicators:  Aching;Discomfort;Burning Pain Frequency: Constant Pain Onset: On-going Pain Intervention(s): Medication (See eMAR) Mobility:   Locomotion :    Trunk/Postural Assessment :    Balance:   Exercises:   Other Treatments:      Therapy/Group: Individual Therapy Rada Hay, PT   Shearon Balo 07/17/2021, 9:12 AM

## 2021-07-17 NOTE — Progress Notes (Signed)
PROGRESS NOTE   Subjective/Complaints: No complaints Pain 4/10 in RLE reported during therapy session On commode  ROS: Denies CP, SOB, N/V/D, pain  Objective:   No results found. Recent Labs    07/15/21 1324  WBC 6.8  HGB 10.3*  HCT 33.8*  PLT 452*   No results for input(s): NA, K, CL, CO2, GLUCOSE, BUN, CREATININE, CALCIUM in the last 72 hours.  Intake/Output Summary (Last 24 hours) at 07/17/2021 1222 Last data filed at 07/17/2021 0800 Gross per 24 hour  Intake 580 ml  Output 800 ml  Net -220 ml        Physical Exam: Vital Signs Blood pressure 126/69, pulse 78, temperature 98 F (36.7 C), temperature source Oral, resp. rate 18, height 5\' 3"  (1.6 m), weight 130 kg, SpO2 100 %. Gen: no distress, normal appearing HEENT: oral mucosa pink and moist, NCAT Cardio: Reg rate Chest: normal effort, normal rate of breathing Abd: soft, non-distended Ext: no edema Psych: pleasant, normal affect Skin: Warm and dry.  Incisions with sutures CDI. Psych: Normal mood.  Normal behavior. Musc: No edema in extremities.  No tenderness in extremities. Neurologic: Alert and oriented Motor: 5/5 in bilateral deltoid, bicep, tricep, grip Right lower extremity 4 -/5 proximal distal, unchanged Left lower extremity: Hip flexion 2+/5, knee extension 3/5, ankle dorsiflexion 4/5  Assessment/Plan: 1. Functional deficits which require 3+ hours per day of interdisciplinary therapy in a comprehensive inpatient rehab setting. Physiatrist is providing close team supervision and 24 hour management of active medical problems listed below. Physiatrist and rehab team continue to assess barriers to discharge/monitor patient progress toward functional and medical goals  Care Tool:  Bathing    Body parts bathed by patient: Right arm, Left arm, Chest, Abdomen, Front perineal area, Buttocks, Right upper leg, Left upper leg, Face   Body parts bathed  by helper: Buttocks, Right lower leg, Left lower leg     Bathing assist Assist Level: Moderate Assistance - Patient 50 - 74%     Upper Body Dressing/Undressing Upper body dressing   What is the patient wearing?: Pull over shirt    Upper body assist Assist Level: Supervision/Verbal cueing    Lower Body Dressing/Undressing Lower body dressing      What is the patient wearing?: Pants     Lower body assist Assist for lower body dressing: Total Assistance - Patient < 25%     Toileting Toileting    Toileting assist Assist for toileting: Moderate Assistance - Patient 50 - 74%     Transfers Chair/bed transfer  Transfers assist     Chair/bed transfer assist level: Minimal Assistance - Patient > 75% (w/ RW)     Locomotion Ambulation   Ambulation assist   Ambulation activity did not occur: Safety/medical concerns          Walk 10 feet activity   Assist  Walk 10 feet activity did not occur: Safety/medical concerns        Walk 50 feet activity   Assist Walk 50 feet with 2 turns activity did not occur: Safety/medical concerns         Walk 150 feet activity   Assist Walk 150 feet activity  did not occur: Safety/medical concerns         Walk 10 feet on uneven surface  activity   Assist Walk 10 feet on uneven surfaces activity did not occur: Safety/medical concerns         Wheelchair     Assist Is the patient using a wheelchair?: Yes Type of Wheelchair: Manual    Wheelchair assist level: Supervision/Verbal cueing Max wheelchair distance: 50    Wheelchair 50 feet with 2 turns activity    Assist        Assist Level: Supervision/Verbal cueing   Wheelchair 150 feet activity     Assist      Assist Level: Moderate Assistance - Patient 50 - 74%   Blood pressure 126/69, pulse 78, temperature 98 F (36.7 C), temperature source Oral, resp. rate 18, height 5\' 3"  (1.6 m), weight 130 kg, SpO2 100 %.  Medical Problem List  and Plan: 1.  Deficits with mobility, transfers, self-care secondary to [facility]. Left FNF s/p ant THA WBAT Right tib fx s/p IM nail TDWB  Continue CIR 2.  DVT prophylaxis/anticoagulation:   -Mechanical: Sequential compression devices, below knee Bilateral lower extremities Pharmaceutical: Other (comment): Eliquis             -antiplatelet therapy: N/A 3. Pain Management: Continue Oxycodone as needed Controlled with meds on 9/23 Monitor with increased exertion 4.  Mood: Team support             -antipsychotic agents:N/A 5. Neuropsych: This patient is capable of making decisions on her own behalf. 6. Skin/Wound Care: Monitor surgical sites and abrasions. 7. Fluids/Electrolytes/Nutrition: Routine I/Os 8.  Morbid obesity: Encourage weight loss 9.  Acute blood loss anemia             Hemoglobin 8.5 on 9/14, labs pending  Hemoccult ordered 10.  GERD             Continue Protonix 11.  Sleep apnea             CPAP not working and currently using supplemental oxygen at bedtime, continue 12.  Drug-induced constipation             Adjust bowel meds as necessary  Improving  Continue miralax.   LOS: 5 days A FACE TO FACE EVALUATION WAS PERFORMED  10/14 Laurelle Skiver 07/17/2021, 12:22 PM

## 2021-07-18 NOTE — Progress Notes (Signed)
Occupational Therapy Session Note  Patient Details  Name: Cindy Valdez MRN: [id number] Date of Birth: 02/15/1966  Today's Date: 07/19/2021 OT Individual Time: 1751-0258 OT Individual Time Calculation (min): 62 min   Short Term Goals: Week 1:  OT Short Term Goal 1 (Week 1): patient will complete rolling in bed and supine to/from sitting with min A OT Short Term Goal 2 (Week 1): patient will complete functional transfer with CS/CGA OT Short Term Goal 3 (Week 1): patient will complete lower body dressing with min A using assistive devices OT Short Term Goal 4 (Week 1): patient will complete toileting with mod A  Skilled Therapeutic Interventions/Progress Updates:    Pt greeted in bed, premedicated for nerve pain in the Lt thigh. She requested to use the restroom. Setup for supine<sit and Total A for donning gripper socks. Min A for stand pivot<w/c<elevated toilet. Mod A for clothing mgt and pt had B+B void. Mod A for toileting tasks with pt able to reach with 1 wash cloth for posterior pericare at seated level, however unable to thoroughly reach, therefore Max A for posterior hygiene provided. Max A for donning clean brief and then pt returned to the w/c. She completed oral care/grooming tasks and UB bathing/dressing while sitting at the sink with setup assistance. Reacher training initiated during LB dressing today. Also brought her a LH sponge to use during her next ADL session with OT. Pt remained sitting up at close of tx, all needs within reach. Tx focus placed on functional transfers, adaptive self care skills, activity tolerance, and adherence to precautions during functional activity.   Therapy Documentation Precautions:  Precautions Precautions: Fall Restrictions Weight Bearing Restrictions: Yes RLE Weight Bearing: Touchdown weight bearing LLE Weight Bearing: Non weight bearing Other Position/Activity Restrictions: left anterior hip precautions  ADL: ADL Eating: Set up Where  Assessed-Eating: Bed level Grooming: Setup, Supervision/safety Where Assessed-Grooming: Sitting at sink Upper Body Bathing: Setup, Supervision/safety Where Assessed-Upper Body Bathing: Sitting at sink Lower Body Bathing: Maximal assistance Where Assessed-Lower Body Bathing: Bed level Upper Body Dressing: Setup, Supervision/safety Where Assessed-Upper Body Dressing: Sitting at sink Lower Body Dressing: Maximal assistance, Dependent Where Assessed-Lower Body Dressing: Bed level Toileting: Maximal assistance, Dependent Where Assessed-Toileting: Bed level     Therapy/Group: Individual Therapy  Carletta Feasel A Nura Cahoon 07/19/2021, 1:34 PM

## 2021-07-18 NOTE — Progress Notes (Signed)
Recent stool OB+ but prior Hgb increased to 10.  Observe for now and recheck cbc Monday.   Ranelle Oyster, MD, [facility] Medicine & Rehabilitation 07/18/2021

## 2021-07-19 LAB — OCCULT BLOOD X 1 CARD TO LAB, STOOL: Fecal Occult Bld: NEGATIVE

## 2021-07-19 NOTE — Progress Notes (Signed)
Slept good. PRN oxy ir 15mg 's & PRN robaxin given at 2148, Patient reports taking Oxy IR 10mg 's during day and 15mg 's at [facility]. Mostly complains of pain to RLE. Left hip & thigh with burning pain. Multi incisions to BLE's. RLE with steris and left hip with foam dressing in place. A

## 2021-07-19 NOTE — Progress Notes (Signed)
Occupational Therapy Session Note  Patient Details  Name: Cindy Valdez MRN: [id number] Date of Birth: [birth year]  Today's Date: 07/20/2021 OT Individual Time: 1020-1047 OT Individual Time Calculation (min): 27 min   Skilled Therapeutic Interventions/Progress Updates:    Pt greeted in the w/c and premedicated for pain. She requested to start session by completing oral care/grooming tasks. Pt did so with setup assistance. Afterwards sock aide training was initiated, pt able to doff both gripper socks using reacher and then don them again using the extra wide sock aide. Pt remained comfortably in the w/c at close of session, all needs within reach.   Therapy Documentation Precautions:  Precautions Precautions: Fall Restrictions Weight Bearing Restrictions: Yes RLE Weight Bearing: Touchdown weight bearing LLE Weight Bearing: Non weight bearing Other Position/Activity Restrictions: left anterior hip precautions ADL: ADL Eating: Set up Where Assessed-Eating: Bed level Grooming: Setup, Supervision/safety Where Assessed-Grooming: Sitting at sink Upper Body Bathing: Setup, Supervision/safety Where Assessed-Upper Body Bathing: Sitting at sink Lower Body Bathing: Maximal assistance Where Assessed-Lower Body Bathing: Bed level Upper Body Dressing: Setup, Supervision/safety Where Assessed-Upper Body Dressing: Sitting at sink Lower Body Dressing: Maximal assistance, Dependent Where Assessed-Lower Body Dressing: Bed level Toileting: Maximal assistance, Dependent Where Assessed-Toileting: Bed level    Therapy/Group: Individual Therapy  Kerstin Crusoe A Luciana Cammarata 07/20/2021, 12:21 PM

## 2021-07-19 NOTE — Progress Notes (Signed)
Physical Therapy Session Note  Patient Details  Name: Cindy Valdez MRN: [id number] Date of Birth: Mar 12, 1966  Today's Date: 07/19/2021 PT Individual Time: 3570-1779 PT Individual Time Calculation (min): 58 min   Short Term Goals: Week 1:  PT Short Term Goal 1 (Week 1): Patient will be able to complete bed mobility with MinA consistently PT Short Term Goal 2 (Week 1): Patient will complete bed <> wc transfer with LRAD and CGA consistently PT Short Term Goal 3 (Week 1): Patient will complete STS with LRAD and MinA consistently PT Short Term Goal 4 (Week 1): Patient will ambulate 20ft with LRAD and assist as needed  Skilled Therapeutic Interventions/Progress Updates: Pt presents sitting in recliner and agreeable to therapy.  Pt states pain about 7/10 to B LEs, although greater in R lower leg.  Pt wheeled to small gym for time conservation.  Pt performed UE cycle 5' forward and 5' backwards at Level 1.5.  Pt performed stating 4/10 difficulty.  Pt performed LE there ex sitting and lying.  Pt performed calf/toe raises 3 x 15 w/ long stretch required to R arch 2/2 c/o cramping, LAQ, abd/add 3 x 15 w/ AAROM for LLE.  Pt transferred sit to stand w/ min A and then able to clear foot to step w/c > bed w/ min A.  Pt required CGA for sit to supine and scoots to center of bed.  Bed alarm on and all needs in reach.     Therapy Documentation Precautions:  Precautions Precautions: Fall Restrictions Weight Bearing Restrictions: Yes RLE Weight Bearing: Touchdown weight bearing LLE Weight Bearing: Non weight bearing Other Position/Activity Restrictions: left anterior hip precautions General:   Vital Signs: Therapy Vitals Temp: 98.6 F (37 C) Temp Source: Oral Pulse Rate: 87 Resp: 20 BP: 118/63 Patient Position (if appropriate): Lying Oxygen Therapy SpO2: 97 % O2 Device: Room Air Pain: 7/10 w/ meds given recently Pain Assessment Pain Score: 1  PAINAD (Pain Assessment in Advanced  Dementia) Breathing: normal Negative Vocalization: none Facial Expression: smiling or inexpressive Body Language: relaxed Consolability: no need to console PAINAD Score: 0     Therapy/Group: Individual Therapy  Lucio Edward 07/19/2021, 3:57 PM

## 2021-07-20 DIAGNOSIS — S72002D Fracture of unspecified part of neck of left femur, subsequent encounter for closed fracture with routine healing: Secondary | ICD-10-CM

## 2021-07-20 LAB — CBC
HCT: 32.6 % — ABNORMAL LOW (ref 36.0–46.0)
Hemoglobin: 9.8 g/dL — ABNORMAL LOW (ref 12.0–15.0)
MCH: 28.6 pg (ref 26.0–34.0)
MCHC: 30.1 g/dL (ref 30.0–36.0)
MCV: 95 fL (ref 80.0–100.0)
Platelets: 283 10*3/uL (ref 150–400)
RBC: 3.43 MIL/uL — ABNORMAL LOW (ref 3.87–5.11)
RDW: 15.5 % (ref 11.5–15.5)
WBC: 5.1 10*3/uL (ref 4.0–10.5)
nRBC: 0 % (ref 0.0–0.2)

## 2021-07-20 MED ORDER — OXYCODONE HCL 5 MG PO TABS
10.0000 mg | ORAL_TABLET | ORAL | Status: DC | PRN
Start: 2021-07-20 — End: 2021-07-23
  Administered 2021-07-20 – 2021-07-22 (×9): 10 mg via ORAL
  Filled 2021-07-20 (×9): qty 2

## 2021-07-20 MED ORDER — OXYCODONE HCL 5 MG PO TABS
5.0000 mg | ORAL_TABLET | ORAL | Status: DC | PRN
  Filled 2021-07-20: qty 1

## 2021-07-20 MED ORDER — DOXYCYCLINE HYCLATE 100 MG PO TABS
100.0000 mg | ORAL_TABLET | Freq: Two times a day (BID) | ORAL | Status: DC
  Administered 2021-07-20 – 2021-07-23 (×7): 100 mg via ORAL
  Filled 2021-07-20 (×8): qty 1
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
## 2021-07-20 NOTE — Progress Notes (Signed)
Occupational Therapy Session Note  Patient Details  Name: Cindy Valdez MRN: [id number] Date of Birth: [birth year]  Today's Date: 07/20/2021 OT Individual Time: 9833-8250 and 5397-6734 OT Individual Time Calculation (min): 57 min and 42 min   Short Term Goals: Week 1:  OT Short Term Goal 1 (Week 1): patient will complete rolling in bed and supine to/from sitting with min A OT Short Term Goal 2 (Week 1): patient will complete functional transfer with CS/CGA OT Short Term Goal 3 (Week 1): patient will complete lower body dressing with min A using assistive devices OT Short Term Goal 4 (Week 1): patient will complete toileting with mod A   Skilled Therapeutic Interventions/Progress Updates:    Pt greeted at time of session sitting up in wheelchair with sister present who remained throughout session for family ed/training. No pain reported throughout. Focus of session on discussion/planning for home set up, door widths, and DME needs for DC planning. Pt wanting to price shop for TTB prior to purchasing. Self propel partially room <> ADL apartment with therapist resuming for fatigue. TTB transfer CGA for standpivot and Min A to fully manage LLE over tub wall. IADL retraining in kitchen from wheelchair level with Supervision to open and access fridge for items. Discussed bathroom door width as well as it is only 22" and will not allow wheelchair, talked through options and will continue to problem solve. Transported back to room and set up with alarm on call bell in reach. Sister present throughout and both happy with pt progress.   Session 2: Pt greeted at time of session sitting up in wheelchair agreeable to OT session, no pain initially but did have "firey pain" in L thigh in standing later on which has been ongoing and nursing aware. Decline ADL as needs are met, transported room <> day room total A for time. Focused on standing balance with unilateral support for 2 rounds of dynamic standing bean bag  toss including throwing with R hand, crossing midline to access bags with L hand, and translating R>L hand for throws. Able to maintain WB precautions throughout. Extended seated rest breaks. Transported back to room and stand pivot wheelchair > bed CGA, used leg lifter to get LLE up onto bed surface. Alarm on call bell in reach.   Therapy Documentation Precautions:  Precautions Precautions: Fall Restrictions Weight Bearing Restrictions: Yes RLE Weight Bearing: Touchdown weight bearing LLE Weight Bearing: Non weight bearing Other Position/Activity Restrictions: left anterior hip precautions     Therapy/Group: Individual Therapy  Viona Gilmore 07/20/2021, 7:28 AM

## 2021-07-20 NOTE — Progress Notes (Signed)
Orthopaedic Trauma Progress Note  SUBJECTIVE: Doing fairly well. Excited to go home on Thursday  OBJECTIVE:  General: Sitting up in WC, NAD.  Respiratory: No increased work of breathing.  RLE: Incision over knee healing well. Traumatic wound to anterior tibia with some dehiscence and serosanguinous drainage. Not fluctuance. No purulence. No significant tenderness throughout lower leg. Able to wiggle toes. Endorses sensation to light touch throughout extremity.  Ankle DF/PF intact.  Compartments compressible.  IMAGING: Repeat imaging R tibia from 07/08/21 stable  ASSESSMENT: Cindy Valdez is a 55 y.o. female s/p REMOVAL EXTERNAL FIXATION AND INTRAMEDULLARY NAIL RIGHT TIBIA 06/24/21 by Dr. Jena Gauss LEFT TOTAL HIP ARTHOPLASTY 06/25/21 by Dr. Magnus Ivan  PLAN: Weightbearing: TDWB RLE Incisional and dressing care: change RLE dressing PRN. Ok to leave open to air if no drainage Showering: Ok to shower, avoid getting RLE traumatic wound wet.  VTE prophylaxis: Eliquis for DVT treatment Impediments to Fracture Healing: Vit D level 58, no supplementation needed  Dispo: Continue with therapies. Have started Doxycyline 100 mg BID x 10 days for RLE wound drainage  Follow - up plan: We will continue to follow while in hospital and plan for outpatient follow-up next week 07/27/21 at 11AM for repeat x-rays and wound check   Contact information:  Truitt Merle MD, Ulyses Southward PA-C. After hours and holidays please check [email or web address] for group call information for [facility]   Jabrea Kallstrom A. Michaelyn Barter, PA-C [phone] (office) [email or web address]

## 2021-07-21 ENCOUNTER — Other Ambulatory Visit (HOSPITAL_COMMUNITY): Payer: Self-pay

## 2021-07-21 NOTE — Plan of Care (Signed)
  Problem: Consults Goal: RH GENERAL PATIENT EDUCATION Description: See Patient Education module for education specifics. Outcome: Progressing Goal: Skin Care Protocol Initiated - if Braden Score 18 or less Description: If consults are not indicated, leave blank or document N/A Outcome: Progressing   Problem: RH SKIN INTEGRITY Goal: RH STG SKIN FREE OF INFECTION/BREAKDOWN Description: Skin will remain free of infection/breakdown with min assist while in rehab Outcome: Progressing Goal: RH STG MAINTAIN SKIN INTEGRITY WITH ASSISTANCE Description: STG Maintain Skin Integrity With min Assistance. Outcome: Progressing   Problem: RH SAFETY Goal: RH STG ADHERE TO SAFETY PRECAUTIONS W/ASSISTANCE/DEVICE Description: STG Adhere to Safety Precautions With Min Assistance/Device. Outcome: Progressing   Problem: RH PAIN MANAGEMENT Goal: RH STG PAIN MANAGED AT OR BELOW PT'S PAIN GOAL Description: < 3 on a 0-10 pain scale. Outcome: Progressing   Problem: RH KNOWLEDGE DEFICIT GENERAL Goal: RH STG INCREASE KNOWLEDGE OF SELF CARE AFTER HOSPITALIZATION Description: Patient will demonstrate knowledge of medication management, pain management, weight bearing precautions with educational materials and handouts provided by staff independently at discharge. Outcome: Progressing

## 2021-07-21 NOTE — Patient Care Conference (Signed)
Inpatient RehabilitationTeam Conference and Plan of Care Update Date: 07/21/2021   Time: 11:48 AM    Patient Name: Cindy Valdez      Medical Record Number: [id number]  Date of Birth: 1966-06-28 Sex: Female         Room/Bed: 4W11C/4W11C-01 Payor Info: Payor: MED PAY / Plan: MED PAY ASSURANCE / Product Type: *No Product type* /    Admit Date/Time:  07/12/2021  2:52 PM  Primary Diagnosis:  Trauma  Hospital Problems: Principal Problem:   Trauma Active Problems:   H/O total hip arthroplasty    Expected Discharge Date: Expected Discharge Date: 07/23/21  Team Members Present: Physician leading conference: Dr. Genice Rouge Social Worker Present: Dossie Der, LCSW Nurse Present: Kennyth Arnold, RN PT Present: Bernie Covey, PT OT Present: Earleen Newport, OT PPS Coordinator present : Fae Pippin, SLP     Current Status/Progress Goal Weekly Team Focus  Bowel/Bladder   Pt continent of B/B. LBM 07/19/2021  Regular BMs every 3 days or less  Assess B/B every shift and PRN   Swallow/Nutrition/ Hydration             ADL's   Min/CGA toileting, Min/Mod LB bathing sink level, Min LB dressing with reacher, transferes CGA with RW  Supervision tranfers, CGA standing balance, Min A LB bathe/dress  side stepping/bathroom entry, DC planning, ADL retraining, AE training, stand pivot transfers, family training if wanted?   Mobility   CGA stand pivot, supervision w/c, car with CGA  mod I w/c level, supervision standing  Transfers, LE strength, funcitonal mobility   Communication             Safety/Cognition/ Behavioral Observations            Pain   Pt reports pain of 6 before oxycodone 10 mg. Decreased from 15mg  PRN on 07/20/2021. Also uses robaxin  Pain rating of <3/10.  Assess pain every shift and PRN   Skin   incision to RLE and incision to left hip. Doxycycline started 07/20/2021 for drainage to RLE incision  No new breakdown. Improvement of RLE incision appearnance  Assess skin every  skift and PRN     Discharge Planning:      Team Discussion: Doxycycline started. Wound care to left groin added and updated. Will be on Eliquis until 09/23/21. Continent B/B, LBM 07/19/21. 7/10 pain reported. Has Tylenol, Robaxin, and Oxy IR available. Incisions are healing. Still need 1 more occult blood. Nursing educating on medications, skin/wound care, weight bearing status. Barriers are weight bearing status and BP can run low at times. Family will rotate taking care of her. Tomorrow is grad day. At goal level. Discharging 07/23/2021. Doesn't want family education. States that she can direct her care. Contact guard for transfers. Contact guard assist getting into car. May need BSC for home, W/C can't fit into home bathroom.  Patient on target to meet rehab goals: yes  *See Care Plan and progress notes for long and short-term goals.   Revisions to Treatment Plan:  Left groin wound dressing added and updated.   Teaching Needs: Family education, medication management, pain management, skin/wound c ared, weight bearing precautions, transfer training, gait training, W/C training, balance training, endurance training, safety awareness.  Current Barriers to Discharge: Decreased caregiver support, Medical stability, Home enviroment access/layout, Wound care, Lack of/limited family support, Weight, Weight bearing restrictions, and Medication compliance  Possible Resolutions to Barriers: Continue current medications for pain management, provide BSC for toileting at home, provide emotional support, teach skin/wound care.  Medical Summary Current Status: Continent B/B; LBM 9/25; pain 7/10- controlled with meds; L groin wound and R shin wound from complex fx; barriers- low BP and wound care  Barriers to Discharge: Decreased family/caregiver support;Medical stability;Home enviroment access/layout;Wound care;Weight bearing restrictions;Weight  Barriers to Discharge Comments: family to rotate to care  for pt. Possible Resolutions to [employer] Focus: family education pt declined; good with precautions of WB- will use BSC in room since cannot walk to commode in bathroom yet; d/c 9/29- con't wound care   Continued Need for Acute Rehabilitation Level of Care: The patient requires daily medical management by a physician with specialized training in physical medicine and rehabilitation for the following reasons: Direction of a multidisciplinary physical rehabilitation program to maximize functional independence : Yes Medical management of patient stability for increased activity during participation in an intensive rehabilitation regime.: Yes Analysis of laboratory values and/or radiology reports with any subsequent need for medication adjustment and/or medical intervention. : Yes   I attest that I was present, lead the team conference, and concur with the assessment and plan of the team.   Tennis Must 07/21/2021, 5:41 PM

## 2021-07-21 NOTE — Progress Notes (Signed)
PROGRESS NOTE   Subjective/Complaints:   Pt reports feels fine- sleeping well-  Pain manageable- got pain meds due to PT.   LBM yesterday.  Got PO ABX started yesterday due to Ortho thinking compound fx not looking at good as it could.    ROS:  Pt denies SOB, abd pain, CP, N/V/C/D, and vision changes   Objective:   No results found. Recent Labs    07/20/21 0719  WBC 5.1  HGB 9.8*  HCT 32.6*  PLT 283   No results for input(s): NA, K, CL, CO2, GLUCOSE, BUN, CREATININE, CALCIUM in the last 72 hours.  Intake/Output Summary (Last 24 hours) at 07/21/2021 0909 Last data filed at 07/21/2021 0200 Gross per 24 hour  Intake 656 ml  Output 700 ml  Net -44 ml        Physical Exam: Vital Signs Blood pressure (!) 121/55, pulse 78, temperature 98.4 F (36.9 C), resp. rate 16, height 5\' 3"  (1.6 m), weight 131.2 kg, SpO2 100 %.    General: awake, alert, appropriate, sitting up in bed;  NAD HENT: conjugate gaze; oropharynx moist CV: regular rate; no JVD Pulmonary: CTA B/L; no W/R/R- good air movement GI: soft, NT, ND, (+)BS Psychiatric: appropriate Neurological: Ox3  Skin: R calf/shin- compound fx- a little drainage- but serous- mild; also L groin incision saturated/macerated - d/w nursing. No signs of infection of either.  Motor: 5/5 in bilateral deltoid, bicep, tricep, grip Right lower extremity 4 -/5 proximal distal, unchanged Left lower extremity: Hip flexion 2+/5, knee extension 3/5, ankle dorsiflexion 4/5  Assessment/Plan: 1. Functional deficits which require 3+ hours per day of interdisciplinary therapy in a comprehensive inpatient rehab setting. Physiatrist is providing close team supervision and 24 hour management of active medical problems listed below. Physiatrist and rehab team continue to assess barriers to discharge/monitor patient progress toward functional and medical goals  Care Tool:  Bathing     Body parts bathed by patient: Right arm, Left arm, Chest, Abdomen, Front perineal area, Buttocks, Right upper leg, Left upper leg, Face   Body parts bathed by helper: Buttocks, Right lower leg, Left lower leg     Bathing assist Assist Level: Moderate Assistance - Patient 50 - 74%     Upper Body Dressing/Undressing Upper body dressing   What is the patient wearing?: Pull over shirt    Upper body assist Assist Level: Set up assist    Lower Body Dressing/Undressing Lower body dressing      What is the patient wearing?: Pants, Incontinence brief     Lower body assist Assist for lower body dressing: Maximal Assistance - Patient 25 - 49% (using reacher)     Toileting Toileting    Toileting assist Assist for toileting: Moderate Assistance - Patient 50 - 74%     Transfers Chair/bed transfer  Transfers assist     Chair/bed transfer assist level: Supervision/Verbal cueing     Locomotion Ambulation   Ambulation assist   Ambulation activity did not occur: Safety/medical concerns          Walk 10 feet activity   Assist  Walk 10 feet activity did not occur: Safety/medical concerns  Walk 50 feet activity   Assist Walk 50 feet with 2 turns activity did not occur: Safety/medical concerns         Walk 150 feet activity   Assist Walk 150 feet activity did not occur: Safety/medical concerns         Walk 10 feet on uneven surface  activity   Assist Walk 10 feet on uneven surfaces activity did not occur: Safety/medical concerns         Wheelchair     Assist Is the patient using a wheelchair?: Yes Type of Wheelchair: Manual    Wheelchair assist level: Supervision/Verbal cueing Max wheelchair distance: 150    Wheelchair 50 feet with 2 turns activity    Assist        Assist Level: Supervision/Verbal cueing   Wheelchair 150 feet activity     Assist      Assist Level: Supervision/Verbal cueing   Blood pressure (!)  121/55, pulse 78, temperature 98.4 F (36.9 C), resp. rate 16, height 5\' 3"  (1.6 m), weight 131.2 kg, SpO2 100 %.  Medical Problem List and Plan: 1.  Deficits with mobility, transfers, self-care secondary to Cindy Valdez. Left FNF s/p ant THA WBAT Right tib fx s/p IM nail TDWB  9/27- con't PT and OT/CIR 2.  DVT prophylaxis/anticoagulation:   -Mechanical: Sequential compression devices, below knee Bilateral lower extremities Pharmaceutical: Other (comment): Eliquis to cont until 09/23/21             -antiplatelet therapy: N/A 3. Pain Management: Continue Oxycodone as needed Controlled with meds on 9/26- reduce to   5mg  for moderate pain and 10mg  for severe pain averaged ~25mg  of oxy per day over the weekend,  Monitor with increased exertion 4.  Mood: Team support             -antipsychotic agents:N/A 5. Neuropsych: This patient is capable of making decisions on her own behalf. 6. Skin/Wound Care: Monitor surgical sites and abrasions.  9/27- asked nursing to redress L groin daily and prn.  7. Fluids/Electrolytes/Nutrition: Routine I/Os 8.  Morbid obesity: Encourage weight loss 9.  Acute blood loss anemia             Hemoglobin 8.5 on 9/14, 10.3 on 9/21, 9.8 on 9/26, repeat prior to discharge  Hemoccult positive x 2 on 9/23, Neg x 2 9/24 10.  GERD             Continue Protonix 11.  Sleep apnea             CPAP not working and currently using supplemental oxygen at bedtime, continue 12.  Drug-induced constipation             Adjust bowel meds as necessary  Improving  Continue miralax.   9/27- LBM yesterday- going well-   LOS: 9 days A FACE TO FACE EVALUATION WAS PERFORMED  Cindy Valdez 07/21/2021, 9:09 AM

## 2021-07-21 NOTE — Progress Notes (Signed)
Occupational Therapy Note  Patient Details  Name: Cindy Valdez MRN: [id number] Date of Birth: 1966/08/10  Today's Date: 07/21/2021 OT Missed Time: 60 Minutes Missed Time Reason: Patient unwilling/refused to participate without medical reason;Other (comment) (pt declined group session)  Pt missed 60 mins of group session, will f/u as time allows to make up missed minutes.   Pollyann Glen [facility] 07/21/2021, 12:23 PM

## 2021-07-21 NOTE — Progress Notes (Signed)
Physical Therapy Session Note  Patient Details  Name: Cindy Valdez MRN: [id number] Date of Birth: [birth year]  Today's Date: 07/21/2021 PT Individual Time: 1400-1508 PT Individual Time Calculation (min): 68 min   Short Term Goals: Week 1:  PT Short Term Goal 1 (Week 1): Patient will be able to complete bed mobility with MinA consistently PT Short Term Goal 2 (Week 1): Patient will complete bed <> wc transfer with LRAD and CGA consistently PT Short Term Goal 3 (Week 1): Patient will complete STS with LRAD and MinA consistently PT Short Term Goal 4 (Week 1): Patient will ambulate 71ft with LRAD and assist as needed  Skilled Therapeutic Interventions/Progress Updates:    Pt seated in w/c on arrival and agreeable to therapy. Pt reported 5/10 pain, premedicated. Pt transported to therapy gym for time management and energy conservation. Pt ambulated x 7 ft with CGA and RW with hop to pattern on LLE. Pt then directed in side stepping 3 x 5 ft in same manner for access to bathroom per home environment. Discussed guarding techniques and how much supervision/assist she is likely to need at home. Pt was issued HEP as detailed below: Access Code: [email or web address] URL: [email or web address] Date: 07/21/2021 Prepared by: Bernie Covey  Exercises Supine Pelvic Tilt with Straight Leg Raise - 1 x daily - 7 x weekly - 2 sets - 10 reps Supine Hip Abduction - 1 x daily - 7 x weekly - 3 sets - 10 reps Seated Long Arc Quad - 1 x daily - 7 x weekly - 3 sets - 20 reps Seated March - 1 x daily - 7 x weekly - 3 sets - 10 reps Standing Knee Flexion AROM with Chair Support - 1 x daily - 7 x weekly - 3 sets - 10 reps Standing 3-way Hip with Walker - 1 x daily - 7 x weekly - 3 sets - 5 reps   Pt returned to room after session and performed Stand pivot transfer to bed with supervision and RW. Mod I bed mobility with bed rails. Pt remained in bed and was left with all needs in reach and alarm active.    Therapy Documentation Precautions:  Precautions Precautions: Fall Restrictions Weight Bearing Restrictions: Yes RLE Weight Bearing: Touchdown weight bearing LLE Weight Bearing: Non weight bearing Other Position/Activity Restrictions: left anterior hip precautions    Therapy/Group: Individual Therapy  Juluis Rainier 07/21/2021, 4:35 PM

## 2021-07-21 NOTE — Progress Notes (Signed)
Occupational Therapy Session Note  Patient Details  Name: Cindy Valdez MRN: [id number] Date of Birth: 11-08-1965  Today's Date: 07/21/2021 OT Individual Time: 7517-0017 OT Individual Time Calculation (min): 45 min  and Today's Date: 07/21/2021 OT Missed Time: 15 Minutes Missed Time Reason: Other (comment) (pharmacy)   Short Term Goals: Week 1:  OT Short Term Goal 1 (Week 1): patient will complete rolling in bed and supine to/from sitting with min A OT Short Term Goal 2 (Week 1): patient will complete functional transfer with CS/CGA OT Short Term Goal 3 (Week 1): patient will complete lower body dressing with min A using assistive devices OT Short Term Goal 4 (Week 1): patient will complete toileting with mod A   Skilled Therapeutic Interventions/Progress Updates:    Pt greeted at time of session supine in bed resting with friend Ladene Artist in room who did not remain for session, pharmacy entering at this time too and pt missed 15 mins at beginning of session d/t pharmacy. Once returned, agreeable to OT session, no pain throughout but initially stated leg felt "asleep" that improved with standing. Supine > sit Supervision from flat bed to simulate home, stand pivot bed > wheelchair > toilet CGA with RW and good adherance to precautions. On commode, (+) for bowel and bladder, pt able to perform pericare seated on commode and posterior hygiene seated as well with truncal rotation and reaching with RUE. Requesting therapist assist as well for thoroughness but not needed at this time. Discussed again set up at home since wheelchair will not fit through bathroom door, may need BSC in room initially and pt agreeable. Discussed depends/briefs at home to use as well if needed. Stand pivot back to wheelchair same as above, set up at sink for UB bathing set up, LB bathing Min A and donned pants with reacher and Min A to fully pull up in the back. Set up in wheelchair alarm on call bell in reach.   Therapy  Documentation Precautions:  Precautions Precautions: Fall Restrictions Weight Bearing Restrictions: Yes RLE Weight Bearing: Touchdown weight bearing LLE Weight Bearing: Non weight bearing Other Position/Activity Restrictions: left anterior hip precautions     Therapy/Group: Individual Therapy  Erasmo Score 07/21/2021, 7:18 AM

## 2021-07-22 ENCOUNTER — Other Ambulatory Visit (HOSPITAL_COMMUNITY): Payer: Self-pay

## 2021-07-22 MED ORDER — APIXABAN 5 MG PO TABS
5.0000 mg | ORAL_TABLET | Freq: Two times a day (BID) | ORAL | 2 refills | Status: DC
  Filled 2021-07-22: qty 60, 30d supply, fill #0

## 2021-07-22 NOTE — Progress Notes (Signed)
Physical Therapy Discharge Summary  Patient Details  Name: Cindy Valdez MRN: [id number] Date of Birth: 28-Jan-1966  Today's Date: 07/22/2021 PT Individual Time: 0800-0908 PT Individual Time Calculation (min): 68 min    Patient has met 9 of 10 long term goals due to improved activity tolerance, improved balance, improved postural control, increased strength, decreased pain, ability to compensate for deficits, and improved coordination.  Patient to discharge at a wheelchair level Supervision.   Patient's care partner is independent to provide the necessary physical assistance at discharge.  Reasons goals not met: Stair goal not met, pt's family installed ramp.   Recommendation:  Patient will benefit from ongoing skilled PT services in home health setting to continue to advance safe functional mobility, address ongoing impairments in strength, ROM, functional mobility, and minimize fall risk.  Equipment: RW and 22" w/c  Reasons for discharge: treatment goals met and discharge from hospital  Patient/family agrees with progress made and goals achieved: Yes  PT Discharge Precautions/Restrictions Precautions Precautions: Fall;Anterior Hip Precaution Comments: L anterior hip precautions, RLE TDWB Restrictions Weight Bearing Restrictions: Yes RLE Weight Bearing: Touchdown weight bearing LLE Weight Bearing: Weight bearing as tolerated Other Position/Activity Restrictions: left anterior hip precautions Vital Signs Therapy Vitals Temp: 98.2 F (36.8 C) Temp Source: Oral Pulse Rate: 89 Resp: 17 BP: 115/71 Patient Position (if appropriate): Sitting Oxygen Therapy SpO2: 99 % O2 Device: Room Air Pain Pain Assessment Pain Scale: 0-10 Pain Score: 0-No pain Pain Interference Pain Interference Pain Effect on Sleep: 1. Rarely or not at all Pain Interference with Therapy Activities: 1. Rarely or not at all Pain Interference with Day-to-Day Activities: 1. Rarely or not at  all Vision/Perception  Vision - History Ability to See in Adequate Light: 0 Adequate Perception Perception: Within Functional Limits Praxis Praxis: Intact  Cognition Overall Cognitive Status: Within Functional Limits for tasks assessed Arousal/Alertness: Awake/alert Orientation Level: Oriented X4 Year: 2022 Month: September Day of Week: Correct Memory: Appears intact Immediate Memory Recall: Sock;Blue;Bed Memory Recall Sock: Without Cue Memory Recall Blue: Without Cue Memory Recall Bed: Without Cue Awareness: Appears intact Problem Solving: Appears intact Safety/Judgment: Appears intact Sensation Sensation Light Touch: Appears Intact Peripheral sensation comments: numbness and nerve pain on L lateral thigh, area decreased since eval Light Touch Impaired Details: Impaired LLE Hot/Cold: Not tested Proprioception: Appears Intact Stereognosis: Appears Intact Coordination Gross Motor Movements are Fluid and Coordinated: No Fine Motor Movements are Fluid and Coordinated: Yes Coordination and Movement Description: limited and guarded movement due to WB precautions, improved from baseline Finger Nose Finger Test: WNL Motor  Motor Motor: Within Functional Limits Motor - Skilled Clinical Observations: grossly limited d/t TDWB Motor - Discharge Observations: Greatly improved from baseline  Mobility Bed Mobility Rolling Right: Independent with assistive device Rolling Left: Independent with assistive device Supine to Sit: Independent with assistive device Sit to Supine: Independent with assistive device Transfers Transfers: Sit to Stand;Stand to Sit;Stand Pivot Transfers Sit to Stand: Supervision/Verbal cueing Stand to Sit: Supervision/Verbal cueing Stand Pivot Transfers: Supervision/Verbal cueing;Contact Guard/Touching assist Stand Pivot Transfer Details: Verbal cues for sequencing;Verbal cues for technique;Verbal cues for safe use of DME/AE;Verbal cues for  precautions/safety Lateral/Scoot Transfers: Supervision/Verbal cueing Transfer (Assistive device): Rolling walker Locomotion  Gait Ambulation: Yes Gait Assistance: Supervision/Verbal cueing Gait Distance (Feet): 16 Feet Assistive device: Rolling walker Gait Assistance Details: Verbal cues for precautions/safety;Verbal cues for safe use of DME/AE Gait Gait: Yes Gait Pattern:  (hop to pattern) Gait velocity: decr High Level Ambulation High Level Ambulation: Side stepping Side Stepping:  CGA x 6 ft Stairs / Additional Locomotion Stairs: No Pick up small object from the floor assist level: Independent with assistive Production manager Mobility: Yes Wheelchair Assistance: Chartered loss adjuster: Both upper extremities Wheelchair Parts Management: Independent Distance: 150  Trunk/Postural Assessment  Cervical Assessment Cervical Assessment: Within Functional Limits Thoracic Assessment Thoracic Assessment: Within Functional Limits Lumbar Assessment Lumbar Assessment: Within Functional Limits Postural Control Postural Control: Within Functional Limits  Balance Balance Balance Assessed: Yes Static Sitting Balance Static Sitting - Balance Support: Bilateral upper extremity supported Static Sitting - Level of Assistance: 6: Modified independent (Device/Increase time) Dynamic Sitting Balance Dynamic Sitting - Balance Support: During functional activity Dynamic Sitting - Level of Assistance: 5: Stand by assistance Static Standing Balance Static Standing - Balance Support: Bilateral upper extremity supported Static Standing - Level of Assistance: 5: Stand by assistance Dynamic Standing Balance Dynamic Standing - Balance Support: During functional activity;Bilateral upper extremity supported Dynamic Standing - Level of Assistance: 5: Stand by assistance Dynamic Standing - Balance Activities: Lateral lean/weight shifting;Forward lean/weight  shifting;Reaching for objects Extremity Assessment  RUE Assessment RUE Assessment: Within Functional Limits LUE Assessment LUE Assessment: Within Functional Limits RLE Assessment RLE Assessment: Exceptions to [facility] RLE Strength RLE Overall Strength Comments: Formal testing not performedd/t WB precautions. Pt demoes functional improvements in strength LLE Assessment LLE Assessment: Exceptions to [facility] LLE Strength LLE Overall Strength Comments: Grossly 4/5  pt received in bed and agreeable to therapy. Pt reports 5/10 pain, nsg administered medication during session. Supine<>sit mod I from flat bed with no bed rails. Sit to stand with supervision, CGA at times to stabilize RW with fatigue. Stand pivot transfer bed>w/c<>commode with supervision. Min A for hygiene for thoroughness, supervision clothing management. Pt transported to therapy gym for time management and energy conservation. Pt donned pants mod I with reacher, tot A for socks as no sock aid was available. Pt performed car transfer with supervision. Strength and sensation testing performed as documented above. Gait x 16 ft with RW and CGA, side stepping x 6 ft with RW and CGA. Pt returned to room and to bed after session in the same manner as above, and was left with all needs in reach and alarm active.     Mickel Fuchs 07/22/2021, 4:57 PM

## 2021-07-22 NOTE — Progress Notes (Signed)
PROGRESS NOTE   Subjective/Complaints:   Pt reports doing well- ready for d/c tomorrow 9/29.   Asking about dressing changes- appeared to be calcium alginate- but appeared to not have alginate on wounds- so will need 2x/day.     ROS:    Pt denies SOB, abd pain, CP, N/V/C/D, and vision changes   Objective:   No results found. Recent Labs    07/20/21 0719  WBC 5.1  HGB 9.8*  HCT 32.6*  PLT 283   No results for input(s): NA, K, CL, CO2, GLUCOSE, BUN, CREATININE, CALCIUM in the last 72 hours.  Intake/Output Summary (Last 24 hours) at 07/22/2021 0808 Last data filed at 07/21/2021 1313 Gross per 24 hour  Intake 118 ml  Output --  Net 118 ml        Physical Exam: Vital Signs Blood pressure 111/66, pulse 73, temperature 98.1 F (36.7 C), temperature source Oral, resp. rate 18, height 5\' 3"  (1.6 m), weight 131.2 kg, SpO2 99 %.     General: awake, alert, appropriate,  sitting up in bed; NAD HENT: conjugate gaze; oropharynx moist CV: regular rate; no JVD Pulmonary: CTA B/L; no W/R/R- good air movement GI: soft, NT, ND, (+)BS Psychiatric: appropriate Neurological: Ox3  Skin: R calf/shin- compound fx- a little drainage- looks good today; incision less macerated.  Motor: 5/5 in bilateral deltoid, bicep, tricep, grip Right lower extremity 4 -/5 proximal distal, unchanged Left lower extremity: Hip flexion 2+/5, knee extension 3/5, ankle dorsiflexion 4/5  Assessment/Plan: 1. Functional deficits which require 3+ hours per day of interdisciplinary therapy in a comprehensive inpatient rehab setting. Physiatrist is providing close team supervision and 24 hour management of active medical problems listed below. Physiatrist and rehab team continue to assess barriers to discharge/monitor patient progress toward functional and medical goals  Care Tool:  Bathing    Body parts bathed by patient: Right arm, Left arm,  Chest, Abdomen, Front perineal area, Buttocks, Right upper leg, Left upper leg, Face   Body parts bathed by helper: Buttocks, Right lower leg, Left lower leg     Bathing assist Assist Level: Moderate Assistance - Patient 50 - 74%     Upper Body Dressing/Undressing Upper body dressing   What is the patient wearing?: Pull over shirt    Upper body assist Assist Level: Set up assist    Lower Body Dressing/Undressing Lower body dressing      What is the patient wearing?: Pants, Incontinence brief     Lower body assist Assist for lower body dressing: Maximal Assistance - Patient 25 - 49% (using reacher)     Toileting Toileting    Toileting assist Assist for toileting: Minimal Assistance - Patient > 75%     Transfers Chair/bed transfer  Transfers assist     Chair/bed transfer assist level: Supervision/Verbal cueing     Locomotion Ambulation   Ambulation assist   Ambulation activity did not occur: Safety/medical concerns          Walk 10 feet activity   Assist  Walk 10 feet activity did not occur: Safety/medical concerns        Walk 50 feet activity   Assist Walk 50 feet  with 2 turns activity did not occur: Safety/medical concerns         Walk 150 feet activity   Assist Walk 150 feet activity did not occur: Safety/medical concerns         Walk 10 feet on uneven surface  activity   Assist Walk 10 feet on uneven surfaces activity did not occur: Safety/medical concerns         Wheelchair     Assist Is the patient using a wheelchair?: Yes Type of Wheelchair: Manual    Wheelchair assist level: Supervision/Verbal cueing Max wheelchair distance: 150    Wheelchair 50 feet with 2 turns activity    Assist        Assist Level: Supervision/Verbal cueing   Wheelchair 150 feet activity     Assist      Assist Level: Supervision/Verbal cueing   Blood pressure 111/66, pulse 73, temperature 98.1 F (36.7 C), temperature  source Oral, resp. rate 18, height 5\' 3"  (1.6 m), weight 131.2 kg, SpO2 99 %.  Medical Problem List and Plan: 1.  Deficits with mobility, transfers, self-care secondary to [facility]. Left FNF s/p ant THA WBAT Right tib fx s/p IM nail TDWB  9/28- con't PT and OT- d/c tomorrow.  2.  DVT prophylaxis/anticoagulation:   -Mechanical: Sequential compression devices, below knee Bilateral lower extremities Pharmaceutical: Other (comment): Eliquis to cont until 09/23/21             -antiplatelet therapy: N/A 3. Pain Management: Continue Oxycodone as needed Controlled with meds on 9/26- reduce to   5mg  for moderate pain and 10mg  for severe pain averaged ~25mg  of oxy per day over the weekend,  9/28- pain controlled- con't regimen Monitor with increased exertion 4.  Mood: Team support             -antipsychotic agents:N/A 5. Neuropsych: This patient is capable of making decisions on her own behalf. 6. Skin/Wound Care: Monitor surgical sites and abrasions.  9/27- asked nursing to redress L groin daily and prn. 9/28- has calcium alginate, but cannot go home with this- so will change to BID.   7. Fluids/Electrolytes/Nutrition: Routine I/Os 8.  Morbid obesity: Encourage weight loss 9.  Acute blood loss anemia             Hemoglobin 8.5 on 9/14, 10.3 on 9/21, 9.8 on 9/26, repeat prior to discharge  Hemoccult positive x 2 on 9/23, Neg x 2 9/24 10.  GERD             Continue Protonix 11.  Sleep apnea             CPAP not working and currently using supplemental oxygen at bedtime, continue 12.  Drug-induced constipation             Adjust bowel meds as necessary  Improving  Continue miralax.   9/27- LBM yesterday- going well-    LOS: 10 days A FACE TO FACE EVALUATION WAS PERFORMED  Cindy Valdez 07/22/2021, 8:08 AM
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
## 2021-07-22 NOTE — Progress Notes (Signed)
Patient ID: Cindy Valdez, female   DOB: Oct 18, 1966, 55 y.o.   MRN: [id number]  [facility], Levan Hurst, Advertising copywriter, and Wheelchair ordered through [employer].   [place], [place] [phone]

## 2021-07-22 NOTE — Progress Notes (Signed)
Inpatient Rehabilitation Care Coordinator Discharge Note   Patient Details  Name: Cindy Valdez MRN: [id number] Date of Birth: 05-25-1966   Discharge location: Home  Length of Stay: 11 Days  Discharge activity level: CGA  Home/community participation: family able to assist  Patient response IW:PYKDXI Literacy - How often do you need to have someone help you when you read instructions, pamphlets, or other written material from your doctor or pharmacy?: Never  Patient response PJ:ASNKNL Isolation - How often do you feel lonely or isolated from those around you?: Never  Services provided included: MD, RD, PT, OT, SLP, RN, CM, TR, Pharmacy, SW  Financial Services:  Financial Services Utilized: [employer]  Choices offered to/list presented to: pt  Follow-up services arranged:  Outpatient    Outpatient Servicies: [facility]      Patient response to transportation need: Is the patient able to respond to transportation needs?: Yes In the past 12 months, has lack of transportation kept you from medical appointments or from getting medications?: No In the past 12 months, has lack of transportation kept you from meetings, work, or from getting things needed for daily living?: No    Comments (or additional information):  Patient/Family verbalized understanding of follow-up arrangements:  Yes  Individual responsible for coordination of the follow-up plan: self [phone]  Confirmed correct DME delivered: Andria Rhein 07/22/2021    Andria Rhein

## 2021-07-22 NOTE — Progress Notes (Signed)
Occupational Therapy Session Note  Patient Details  Name: Cindy Valdez MRN: [id number] Date of Birth: Mar 20, 1966  Today's Date: 07/22/2021 OT Individual Time: 1300-1356 and 1515-1630 OT Individual Time Calculation (min): 56 min and 75 min   Short Term Goals: Week 1:  OT Short Term Goal 1 (Week 1): patient will complete rolling in bed and supine to/from sitting with min A OT Short Term Goal 2 (Week 1): patient will complete functional transfer with CS/CGA OT Short Term Goal 3 (Week 1): patient will complete lower body dressing with min A using assistive devices OT Short Term Goal 4 (Week 1): patient will complete toileting with mod A   Skilled Therapeutic Interventions/Progress Updates:    Session 1:Pt greeted at time of session semireclined in bed finishing up med pass with nursing, did not provide # at time of session. Pt also at beginning of session completing order via phone with [employer] regarding ordering equipment. Once completed, demonstrated and practiced how to cover RLE for waterproofing for a shower, pt verbalized understanding and planned to do later in 2nd session. Supine > sit Mod I and stand pivot bed > wheelchair > BSC over toilet with CGA/close supervision. Having pt direct her care throughout for wheelchair, proximity, assistance needed, etc for teach back. CGA/Min A for clothing management in standing and pt able to perform pericare. Stand pivot back to wheelchair same as above, set up in w/c with alarm on call bell in reach.   Session 2: Pt greeted at time of session no pain, agreeable to shower level bathing. Transported to bathroom via w/c and stand pivot w/c <> bench in shower with CGA/close supervision with new RW (adjusted to her needed height) and doffed LB clothing in standing. Covered RLE wound with water proof bagging, again reviewing technique for home. Covered L hip incision as well with tegaderm, reviewed with pt techniques to cover both and where to purchase  materials. UB/LB bathing seated on bench CGA overall with LHS to wash feet. Dried off with assist past knee level and donned shirt set up, brief with Mod/Max A to fully pull up d/t fatigue. Stand pivot shower > w/c > bed CGA/close supervision with RW. Grooming and drying of hair in bed with set up. Reviewed with pt BUE HEP with theraband and pt verbalized understanding. Alarm on call bell in reach. Spoke with RN as well able changing dressing at hip and is aware.   Therapy Documentation Precautions:  Precautions Precautions: Fall Restrictions Weight Bearing Restrictions: Yes RLE Weight Bearing: Touchdown weight bearing LLE Weight Bearing: Non weight bearing Other Position/Activity Restrictions: left anterior hip precautions     Therapy/Group: Individual Therapy  Erasmo Score 07/22/2021, 7:22 AM

## 2021-07-22 NOTE — Progress Notes (Signed)
Patient ID: Cindy Valdez, female   DOB: Jun 23, 1966, 55 y.o.   MRN: [id number] Team Conference Report to Patient/Family  Team Conference discussion was reviewed with the patient and caregiver, including goals, any changes in plan of care and target discharge date.  Patient and caregiver express understanding and are in agreement.  The patient has a target discharge date of 07/23/21.  Sw met with patient to ensure conference updates were provided. Updates provided on 9/27. SW reviewed all recommendations and provided [employer] contact information.  Dyanne Iha 07/22/2021, 1:42 PM

## 2021-07-22 NOTE — TOC Benefit Eligibility Note (Signed)
Patient Product/process development scientist completed.    The patient is currently admitted and upon discharge could be taking Eliquis 5 mg.  The current 30 day co-pay is, $20.00.   The patient is insured through [employer]     Roland Earl, CPhT Pharmacy Patient Advocate Specialist [facility] Antimicrobial Stewardship Team Direct Number: [phone]  Fax: [phone]

## 2021-07-22 NOTE — Progress Notes (Signed)
Occupational Therapy Discharge Summary  Patient Details  Name: Cindy Valdez MRN: [id number] Date of Birth: 02-Sep-1966    Patient has met 9 of 10 long term goals due to improved activity tolerance, improved balance, postural control, ability to compensate for deficits, improved awareness, and improved coordination.  Patient to discharge at [facility] Assist level.  Patient's care partner is independent to provide the necessary physical assistance at discharge. Pt is performing stand pivot transfers with RW CGA/close supervision to [facility], toilet, wheelchair, and bed while maintaining WB precautions. Pt is Min-Mod for LB dressing mostly needing assist for brief management, Min/CGA with toileting tasks, and CGA for shower level bathing. Pt will be going home with alternating caregivers and politely declined family ed stating she felt comfortable directing care. Pt's w/c will not fit in bathroom, pt is aware of BSC in room until strong enough to side step through doorway.    Reasons goals not met: Pt is CGA for tub shower transfers with TTB  Recommendation:  Patient will benefit from ongoing skilled OT services in home health setting to continue to advance functional skills in the area of BADL and Reduce care partner burden.  Equipment: TTB and bariatric BSC  Reasons for discharge: treatment goals met and discharge from hospital  Patient/family agrees with progress made and goals achieved: Yes  OT Discharge Precautions/Restrictions  Precautions Precautions: Fall;Anterior Hip Precaution Comments: L anterior hip precautions, RLE TDWB Restrictions Weight Bearing Restrictions: Yes RLE Weight Bearing: Touchdown weight bearing LLE Weight Bearing: Weight bearing as tolerated Other Position/Activity Restrictions: left anterior hip precautions Vital Signs Therapy Vitals Temp: 98.2 F (36.8 C) Temp Source: Oral Pulse Rate: 89 Resp: 17 BP: 115/71 Patient Position (if appropriate):  Sitting Oxygen Therapy SpO2: 99 % O2 Device: Room Air Pain Pain Assessment Pain Scale: 0-10 Pain Score: 0-No pain Pain Type: Acute pain Pain Orientation: Right;Left Pain Descriptors / Indicators: Aching Pain Frequency: Intermittent Pain Onset: On-going Pain Intervention(s): Medication (See eMAR) ADL ADL Equipment Provided: Reacher, Long-handled sponge Eating: Independent Where Assessed-Eating: Bed level Grooming: Setup Where Assessed-Grooming: Sitting at sink Upper Body Bathing: Setup Where Assessed-Upper Body Bathing: Sitting at sink Lower Body Bathing: Contact guard Where Assessed-Lower Body Bathing: Shower Upper Body Dressing: Setup Where Assessed-Upper Body Dressing: Sitting at sink Lower Body Dressing: Moderate assistance Where Assessed-Lower Body Dressing: Bed level Toileting: Minimal assistance Where Assessed-Toileting: Bed level Toilet Transfer: Close supervision Toilet Transfer Method: Stand pivot Vision Baseline Vision/History: 1 Wears glasses Patient Visual Report: No change from baseline Vision Assessment?: No apparent visual deficits Perception  Perception: Within Functional Limits Praxis Praxis: Intact Cognition Overall Cognitive Status: Within Functional Limits for tasks assessed Arousal/Alertness: Awake/alert Orientation Level: Oriented X4 Year: 2022 Month: September Day of Week: Correct Memory: Appears intact Immediate Memory Recall: Sock;Blue;Bed Memory Recall Sock: Without Cue Memory Recall Blue: Without Cue Memory Recall Bed: Without Cue Awareness: Appears intact Problem Solving: Appears intact Safety/Judgment: Appears intact Sensation Sensation Light Touch: Appears Intact Peripheral sensation comments: numbness and nerve pain on L lateral thigh, area decreased since eval Light Touch Impaired Details: Impaired LLE Hot/Cold: Not tested Proprioception: Appears Intact Stereognosis: Appears Intact Coordination Gross Motor Movements are  Fluid and Coordinated: No Fine Motor Movements are Fluid and Coordinated: Yes Coordination and Movement Description: limited and guarded movement due to WB precautions, improved from baseline Finger Nose Finger Test: WNL Motor  Motor Motor: Within Functional Limits Motor - Skilled Clinical Observations: grossly limited d/t TDWB Motor - Discharge Observations: Greatly improved from baseline Mobility  Transfers Sit to Stand: Supervision/Verbal cueing Stand to Sit: Supervision/Verbal cueing  Trunk/Postural Assessment  Cervical Assessment Cervical Assessment: Within Functional Limits Thoracic Assessment Thoracic Assessment: Within Functional Limits Lumbar Assessment Lumbar Assessment: Within Functional Limits Postural Control Postural Control: Within Functional Limits  Balance Balance Balance Assessed: Yes Static Sitting Balance Static Sitting - Level of Assistance: 6: Modified independent (Device/Increase time) Dynamic Sitting Balance Dynamic Sitting - Balance Support: During functional activity Dynamic Sitting - Level of Assistance: 5: Stand by assistance Static Standing Balance Static Standing - Balance Support: Bilateral upper extremity supported Static Standing - Level of Assistance: 5: Stand by assistance Dynamic Standing Balance Dynamic Standing - Balance Support: During functional activity;Bilateral upper extremity supported Dynamic Standing - Level of Assistance: 5: Stand by assistance (Supervision/CGA) Dynamic Standing - Balance Activities: Lateral lean/weight shifting;Forward lean/weight shifting;Reaching for objects Extremity/Trunk Assessment RUE Assessment RUE Assessment: Within Functional Limits LUE Assessment LUE Assessment: Within Functional Limits   Viona Gilmore 07/22/2021, 4:43 PM

## 2021-07-22 NOTE — Progress Notes (Deleted)
Patient ID: Cindy Valdez, female   DOB: May 31, 1966, 55 y.o.   MRN: [id number] Team Conference Report to Patient/Family  Team Conference discussion was reviewed with the patient and caregiver, including goals, any changes in plan of care and target discharge date.  Patient and caregiver express understanding and are in agreement.  The patient has a target discharge date of 07/23/21.  SW met with pt and spouse provided team conference updates. Spouse requesting to hear d/c date aside from patient before reporting. No additional questions or concerns, sw will continue to follow up. Dyanne Iha 07/22/2021, 1:46 PM

## 2021-07-23 ENCOUNTER — Other Ambulatory Visit (HOSPITAL_COMMUNITY): Payer: Self-pay

## 2021-07-23 MED ORDER — ACETAMINOPHEN 325 MG PO TABS: 325.0000 mg | ORAL_TABLET | Freq: Four times a day (QID) | ORAL | Status: DC | PRN

## 2021-07-23 MED ORDER — METHOCARBAMOL 500 MG PO TABS
500.0000 mg | ORAL_TABLET | Freq: Four times a day (QID) | ORAL | 0 refills | Status: AC | PRN
  Filled 2021-07-23: qty 45, 12d supply, fill #0

## 2021-07-23 MED ORDER — DOCUSATE SODIUM 100 MG PO CAPS
100.0000 mg | ORAL_CAPSULE | Freq: Two times a day (BID) | ORAL | 0 refills | Status: AC
  Filled 2021-07-23: qty 60, 30d supply, fill #0

## 2021-07-23 MED ORDER — POLYETHYLENE GLYCOL 3350 17 GM/SCOOP PO POWD
17.0000 g | Freq: Every day | ORAL | 0 refills | Status: AC
  Filled 2021-07-23: qty 510, 30d supply, fill #0

## 2021-07-23 MED ORDER — PANTOPRAZOLE SODIUM 40 MG PO TBEC
40.0000 mg | DELAYED_RELEASE_TABLET | Freq: Every day | ORAL | 0 refills | Status: AC
  Filled 2021-07-23: qty 30, 30d supply, fill #0

## 2021-07-23 MED ORDER — ACETAMINOPHEN 325 MG PO TABS
325.0000 mg | ORAL_TABLET | Freq: Four times a day (QID) | ORAL | 0 refills | Status: AC | PRN
  Filled 2021-07-23: qty 100, 13d supply, fill #0

## 2021-07-23 MED ORDER — GABAPENTIN 100 MG PO CAPS
100.0000 mg | ORAL_CAPSULE | Freq: Three times a day (TID) | ORAL | 0 refills | Status: AC
  Filled 2021-07-23: qty 90, 30d supply, fill #0

## 2021-07-23 MED ORDER — DOXYCYCLINE HYCLATE 100 MG PO TABS
100.0000 mg | ORAL_TABLET | Freq: Two times a day (BID) | ORAL | 0 refills | Status: AC
  Filled 2021-07-23: qty 14, 7d supply, fill #0

## 2021-07-23 MED ORDER — FERROUS GLUCONATE 324 (38 FE) MG PO TABS
324.0000 mg | ORAL_TABLET | Freq: Two times a day (BID) | ORAL | 0 refills | Status: AC
  Filled 2021-07-23: qty 60, 30d supply, fill #0

## 2021-07-23 MED ORDER — OXYCODONE HCL 10 MG PO TABS
5.0000 mg | ORAL_TABLET | ORAL | 0 refills | Status: AC | PRN
  Filled 2021-07-23: qty 35, 6d supply, fill #0

## 2021-07-23 MED ORDER — PROSIGHT PO TABS
1.0000 | ORAL_TABLET | Freq: Every day | ORAL | 0 refills | Status: AC
  Filled 2021-07-23: qty 30, 30d supply, fill #0

## 2021-07-23 NOTE — Progress Notes (Addendum)
Discharge--Inpatient Rehabilitation Medication Review by a Pharmacist  A complete drug regimen review was completed for this patient to identify any potential clinically significant medication issues.  High Risk Drug Classes Is patient taking? Indication by Medication  Antipsychotic No   Anticoagulant Yes Eliquis for DVT  Antibiotic Yes Doxy for wound drainage  Opioid Yes Oxy s/p ortho injuries.  Antiplatelet No   Hypoglycemics/insulin No   Vasoactive Medication No   Chemotherapy No   Other No     Time spent performing this drug regimen review (minutes):   Cindy Valdez S. Merilynn Finland, PharmD, BCPS Clinical Staff Pharmacist [email or web address] Pasty Spillers 07/23/2021 9:26 AM

## 2021-07-23 NOTE — Progress Notes (Signed)
Patient ID: Cindy Valdez, female   DOB: [birth year], 55 y.o.   MRN: [id number]  New Patient Visit with Alysia Penna, NP on Friday November 06, 2021  9:30    LB Primary [facility] [address] [place] [address] [phone]  Lavera Guise, [place] [phone]
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
## 2021-07-23 NOTE — Progress Notes (Signed)
Discharge Note: Alert and oriented x4, compliant with medication administration. No signs or symptoms of distress noted. Discharge instructions/medications reviewed by Delle Reining, PA. Patient and family member verbalized understanding.  Staff assisted patient off unit via wheelchair accompanied by family. Patient departed via private car free of complications.

## 2021-07-23 NOTE — Progress Notes (Signed)
Patient ID: Cindy Valdez, female   DOB: 02-Jul-1966, 55 y.o.   MRN: [id number]  Sw followed up with patient on discharge questions. Pt reports no PCP, sw will assist pt with PCP set up.  Pt awaiting [facility]  [place], [place] [phone]
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
## 2021-08-04 ENCOUNTER — Other Ambulatory Visit: Payer: Self-pay

## 2021-08-07 ENCOUNTER — Other Ambulatory Visit (HOSPITAL_COMMUNITY): Payer: Self-pay

## 2021-08-10 ENCOUNTER — Ambulatory Visit (INDEPENDENT_AMBULATORY_CARE_PROVIDER_SITE_OTHER): Payer: BC Managed Care – PPO | Admitting: Orthopaedic Surgery

## 2021-08-10 ENCOUNTER — Encounter: Payer: Self-pay | Admitting: Orthopaedic Surgery

## 2021-08-10 DIAGNOSIS — Z96642 Presence of left artificial hip joint: Secondary | ICD-10-CM
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
## 2021-08-11 ENCOUNTER — Ambulatory Visit: Payer: Self-pay | Admitting: Physical Therapy

## 2021-08-14 ENCOUNTER — Ambulatory Visit: Payer: BC Managed Care – PPO | Attending: Physical Medicine and Rehabilitation | Admitting: Physical Therapy

## 2021-08-14 ENCOUNTER — Other Ambulatory Visit: Payer: Self-pay

## 2021-08-14 DIAGNOSIS — M25671 Stiffness of right ankle, not elsewhere classified: Secondary | ICD-10-CM | POA: Diagnosis present

## 2021-08-14 DIAGNOSIS — R2689 Other abnormalities of gait and mobility: Secondary | ICD-10-CM | POA: Diagnosis present

## 2021-08-14 DIAGNOSIS — R262 Difficulty in walking, not elsewhere classified: Secondary | ICD-10-CM | POA: Insufficient documentation

## 2021-08-14 DIAGNOSIS — R600 Localized edema: Secondary | ICD-10-CM | POA: Insufficient documentation

## 2021-08-14 DIAGNOSIS — M25652 Stiffness of left hip, not elsewhere classified: Secondary | ICD-10-CM | POA: Insufficient documentation

## 2021-08-14 DIAGNOSIS — M6281 Muscle weakness (generalized): Secondary | ICD-10-CM | POA: Diagnosis present
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
## 2021-08-14 NOTE — Patient Instructions (Signed)
Access Code: [email or web address] URL: [email or web address] Date: 08/14/2021 Prepared by: Vernon Prey April Kirstie Peri  Exercises Sit to Stand Without Arm Support - 1 x daily - 7 x weekly - 2 sets - 10 reps Standing Heel Raise with Support - 1 x daily - 7 x weekly - 2 sets - 10 reps Standing Toe Raises at Chair - 1 x daily - 7 x weekly - 2 sets - 10 reps Standing Ankle Dorsiflexion Stretch on Chair - 1 x daily - 7 x weekly - 1 sets - 10 reps Towel Scrunches - 1 x daily - 7 x weekly - 2 sets - 10 reps Ankle Inversion Eversion Towel Slide - 1 x daily - 7 x weekly - 2 sets - 10 reps

## 2021-08-18 ENCOUNTER — Ambulatory Visit: Payer: BC Managed Care – PPO | Admitting: Physical Therapy

## 2021-08-20 ENCOUNTER — Other Ambulatory Visit (HOSPITAL_BASED_OUTPATIENT_CLINIC_OR_DEPARTMENT_OTHER): Payer: Self-pay

## 2021-08-20 ENCOUNTER — Encounter: Payer: Self-pay | Admitting: Physical Therapy

## 2021-08-20 ENCOUNTER — Ambulatory Visit: Payer: BC Managed Care – PPO | Admitting: Physical Therapy

## 2021-08-20 ENCOUNTER — Other Ambulatory Visit (HOSPITAL_COMMUNITY): Payer: Self-pay

## 2021-08-20 ENCOUNTER — Other Ambulatory Visit: Payer: Self-pay

## 2021-08-20 DIAGNOSIS — R600 Localized edema: Secondary | ICD-10-CM

## 2021-08-20 DIAGNOSIS — M25671 Stiffness of right ankle, not elsewhere classified: Secondary | ICD-10-CM

## 2021-08-20 DIAGNOSIS — R262 Difficulty in walking, not elsewhere classified: Secondary | ICD-10-CM

## 2021-08-20 DIAGNOSIS — M25652 Stiffness of left hip, not elsewhere classified: Secondary | ICD-10-CM

## 2021-08-20 DIAGNOSIS — M6281 Muscle weakness (generalized): Secondary | ICD-10-CM

## 2021-08-20 DIAGNOSIS — R2689 Other abnormalities of gait and mobility: Secondary | ICD-10-CM
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
## 2021-08-25 ENCOUNTER — Ambulatory Visit: Payer: BC Managed Care – PPO | Admitting: Physical Therapy

## 2021-08-27 ENCOUNTER — Other Ambulatory Visit: Payer: Self-pay

## 2021-08-27 ENCOUNTER — Ambulatory Visit: Payer: BC Managed Care – PPO | Attending: Physical Medicine and Rehabilitation | Admitting: Physical Therapy

## 2021-08-27 ENCOUNTER — Encounter: Payer: Self-pay | Admitting: Physical Therapy

## 2021-08-27 ENCOUNTER — Encounter: Payer: Self-pay | Admitting: Physical Medicine and Rehabilitation

## 2021-08-27 DIAGNOSIS — M25652 Stiffness of left hip, not elsewhere classified: Secondary | ICD-10-CM | POA: Diagnosis present

## 2021-08-27 DIAGNOSIS — R262 Difficulty in walking, not elsewhere classified: Secondary | ICD-10-CM | POA: Diagnosis present

## 2021-08-27 DIAGNOSIS — R600 Localized edema: Secondary | ICD-10-CM | POA: Diagnosis present

## 2021-08-27 DIAGNOSIS — R2689 Other abnormalities of gait and mobility: Secondary | ICD-10-CM | POA: Diagnosis present

## 2021-08-27 DIAGNOSIS — M25671 Stiffness of right ankle, not elsewhere classified: Secondary | ICD-10-CM | POA: Insufficient documentation

## 2021-08-27 DIAGNOSIS — M6281 Muscle weakness (generalized): Secondary | ICD-10-CM | POA: Diagnosis present
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
## 2021-09-01 ENCOUNTER — Ambulatory Visit: Payer: BC Managed Care – PPO | Admitting: Physical Therapy

## 2021-09-01 NOTE — Addendum Note (Signed)
Addended by: Jules Husbands MARIE L on: 09/01/2021 04:22 PM   Modules accepted: Orders

## 2021-09-03 ENCOUNTER — Ambulatory Visit: Payer: BC Managed Care – PPO | Admitting: Physical Therapy

## 2021-09-07 ENCOUNTER — Encounter: Payer: BC Managed Care – PPO | Admitting: Orthopaedic Surgery

## 2021-09-08 ENCOUNTER — Ambulatory Visit: Payer: BC Managed Care – PPO | Admitting: Physical Therapy

## 2021-09-10 ENCOUNTER — Ambulatory Visit: Payer: BC Managed Care – PPO | Admitting: Physical Therapy

## 2021-09-14 ENCOUNTER — Ambulatory Visit: Payer: BC Managed Care – PPO | Admitting: Physical Therapy

## 2021-09-16 ENCOUNTER — Ambulatory Visit: Payer: BC Managed Care – PPO | Admitting: Physical Therapy

## 2021-09-21 ENCOUNTER — Encounter: Payer: BC Managed Care – PPO | Admitting: Orthopaedic Surgery

## 2021-09-22 ENCOUNTER — Ambulatory Visit: Payer: BC Managed Care – PPO | Admitting: Physical Therapy

## 2021-09-24 ENCOUNTER — Ambulatory Visit: Payer: BC Managed Care – PPO | Attending: Physical Medicine and Rehabilitation | Admitting: Physical Therapy

## 2021-09-24 ENCOUNTER — Telehealth: Payer: Self-pay | Admitting: Physical Therapy

## 2021-09-24 NOTE — Telephone Encounter (Signed)
Ms. Ranganathan was a no show for today's session- I called and left VM asking her to call us back to schedule if she would like to continue with PT.  Lerry Liner PT, DPT, PN2   Supplemental Physical Therapist [facility]    Pager [phone] Acute Rehab Office [phone]

## 2021-10-21 ENCOUNTER — Other Ambulatory Visit: Payer: Self-pay | Admitting: Physical Medicine and Rehabilitation

## 2021-11-06 ENCOUNTER — Ambulatory Visit: Payer: BLUE CROSS/BLUE SHIELD | Admitting: Nurse Practitioner

## 2022-03-02 ENCOUNTER — Encounter: Payer: Self-pay | Admitting: Physical Therapy

## 2022-03-02 NOTE — Therapy (Signed)
[place] [address] ?[place], [place], [address] ?Phone: [phone]   Fax:  [phone] ? ?Patient Details  ?Name: Cindy Valdez ?MRN: [id number] ?Date of Birth: 29-Nov-1965 ?Referring Provider:  No ref. provider found ? ?Encounter Date: 03/02/2022 ? ?PHYSICAL THERAPY DISCHARGE SUMMARY ? ?Visits from Start of Care: 3 ? ?Current functional level related to goals / functional outcomes: ?Did not return since last visit, DC per policy  ?  ?Remaining deficits: ?Unable to assess  ?  ?Education / Equipment: ?N/A   ? ?Patient agrees to discharge. Patient goals were not met. Patient is being discharged due to not returning since the last visit. ? ? ?Makaylah Oddo U PT, DPT, PN2  ? ?Supplemental Physical Therapist ?[place]  ? ? ? ? ? ?[place] ?[address] ?[place], [place], [address] ?Phone: [phone]   Fax:  [phone] ?
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
## 2022-09-18 IMAGING — DX DG TIBIA/FIBULA PORT 2V*R*
1 series · 4 of 4 positions shown · non-contrast
Comparison: 06/23/2021

CLINICAL DATA: Right tibia ORIF

EXAM:
PORTABLE RIGHT TIBIA AND FIBULA - 2 VIEW

[Series 1: leg · 0.14mm/px · 4 of 4 slices shown]
[im 1/4]
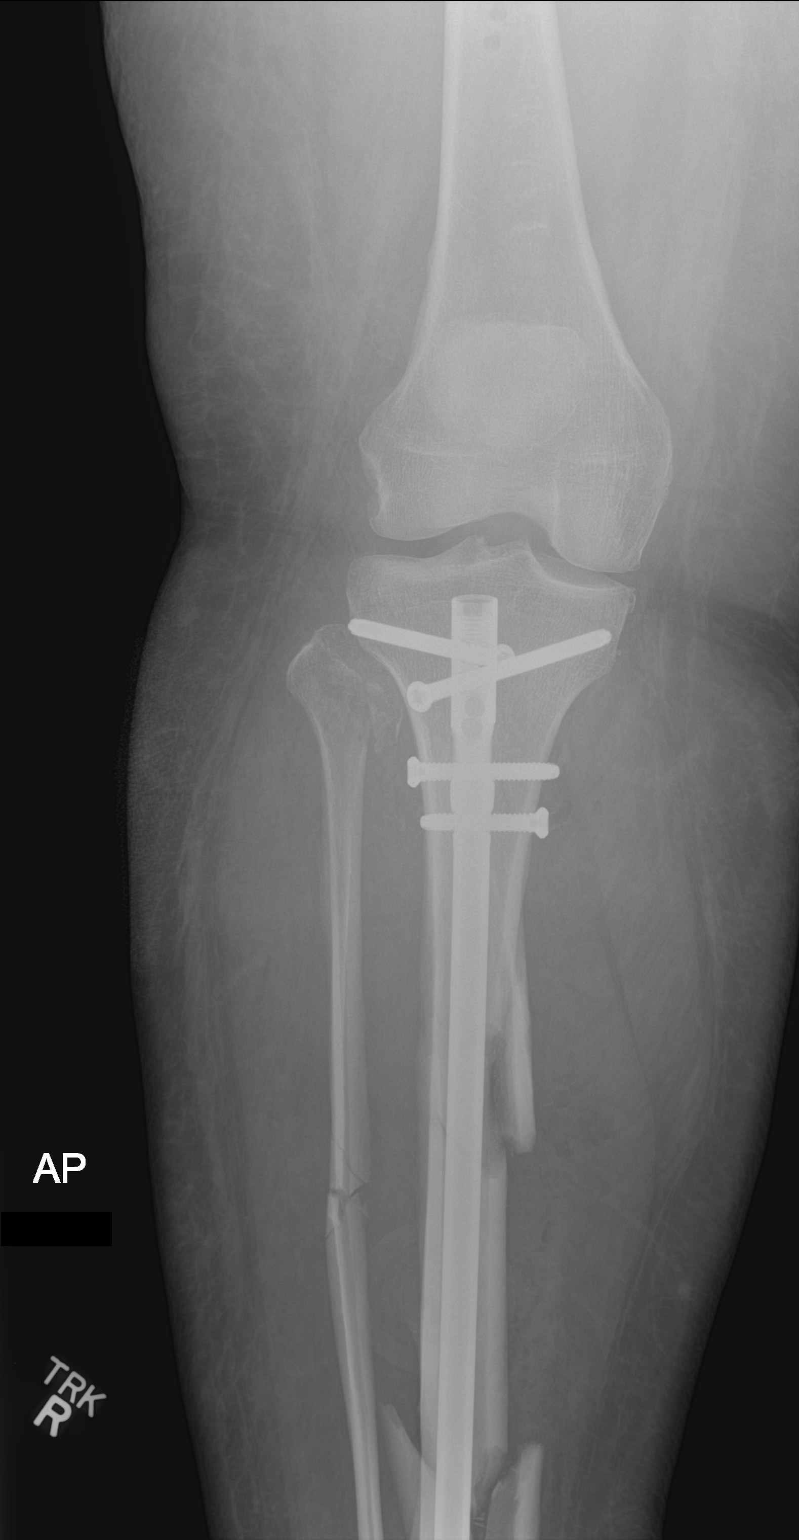
[im 2/4]
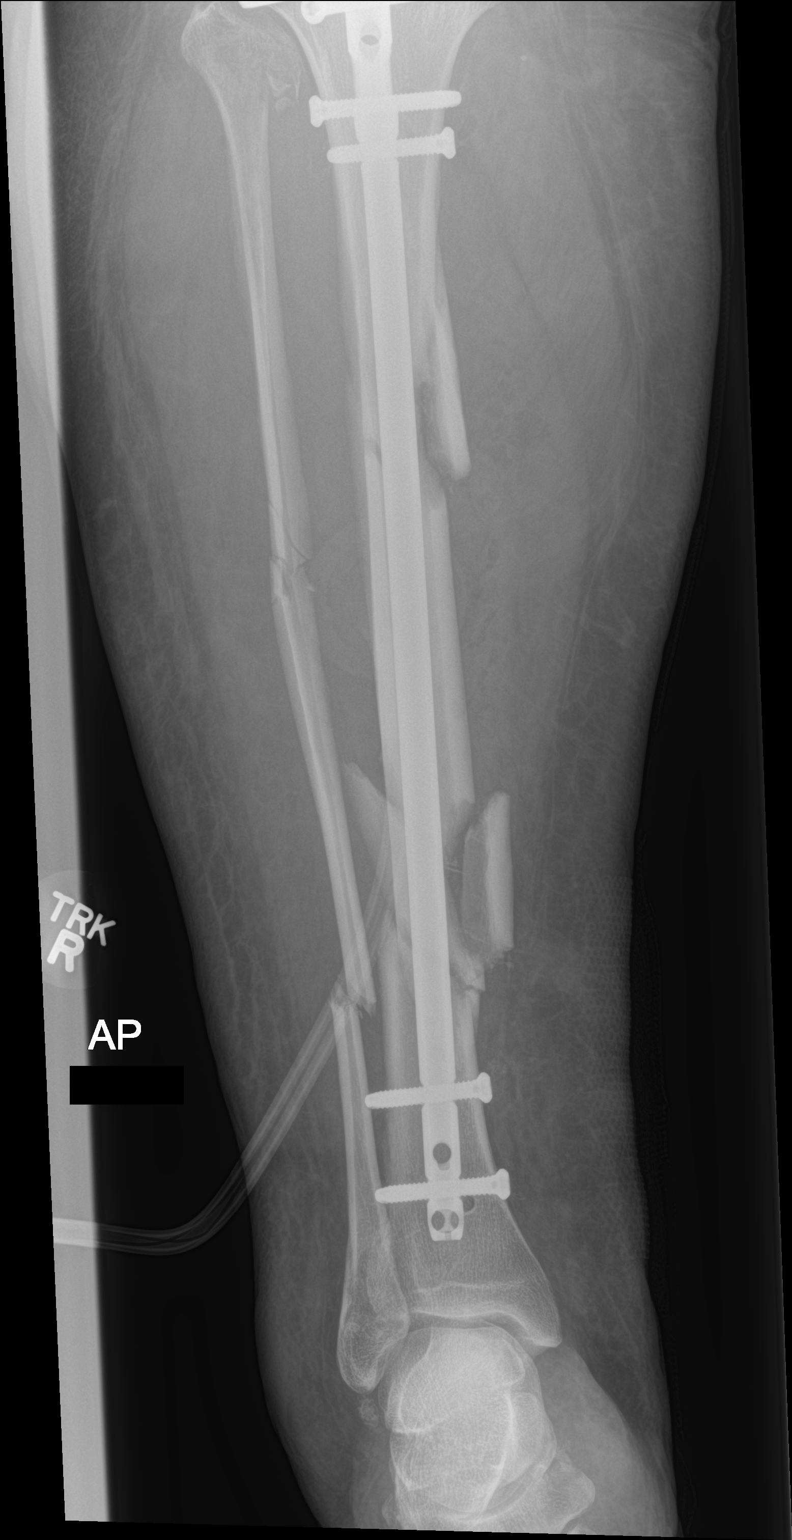
[im 3/4]
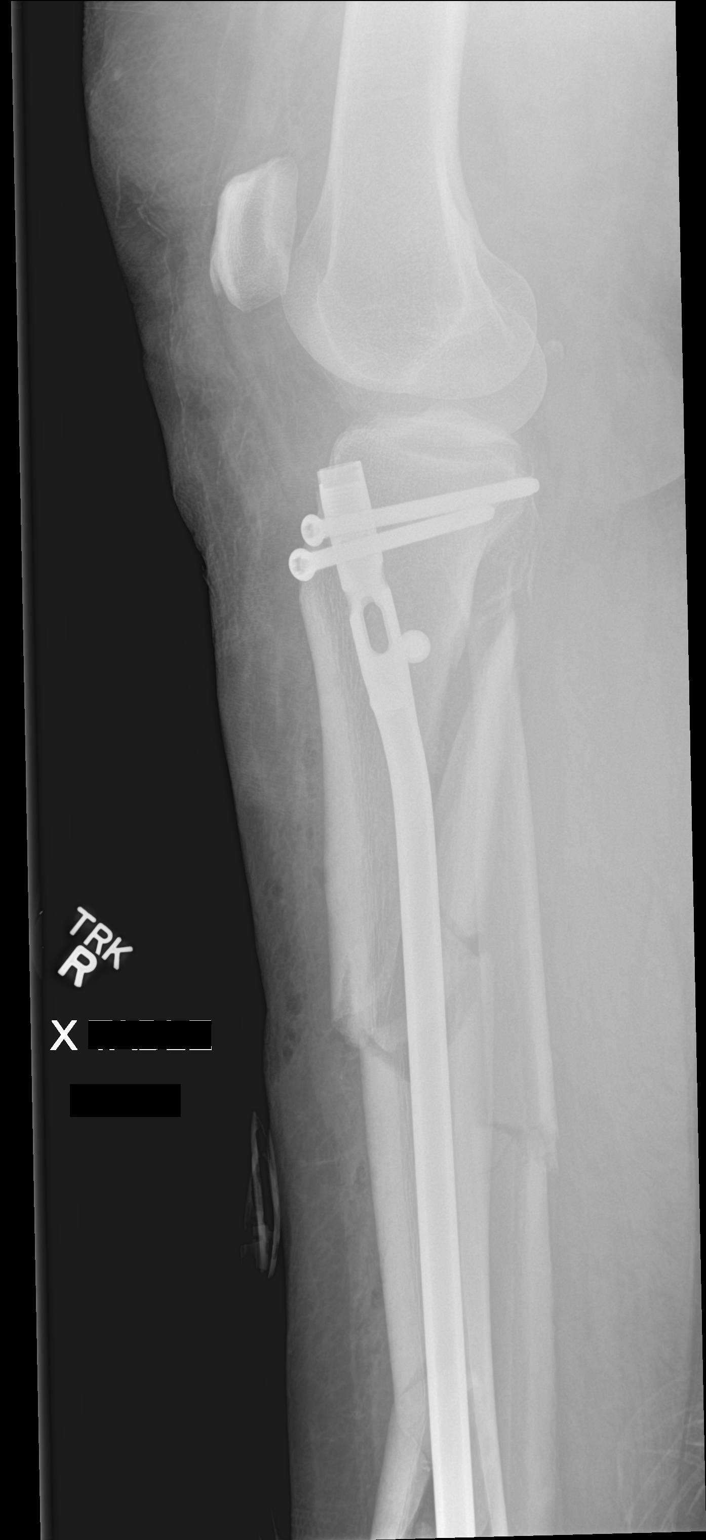
[im 4/4]
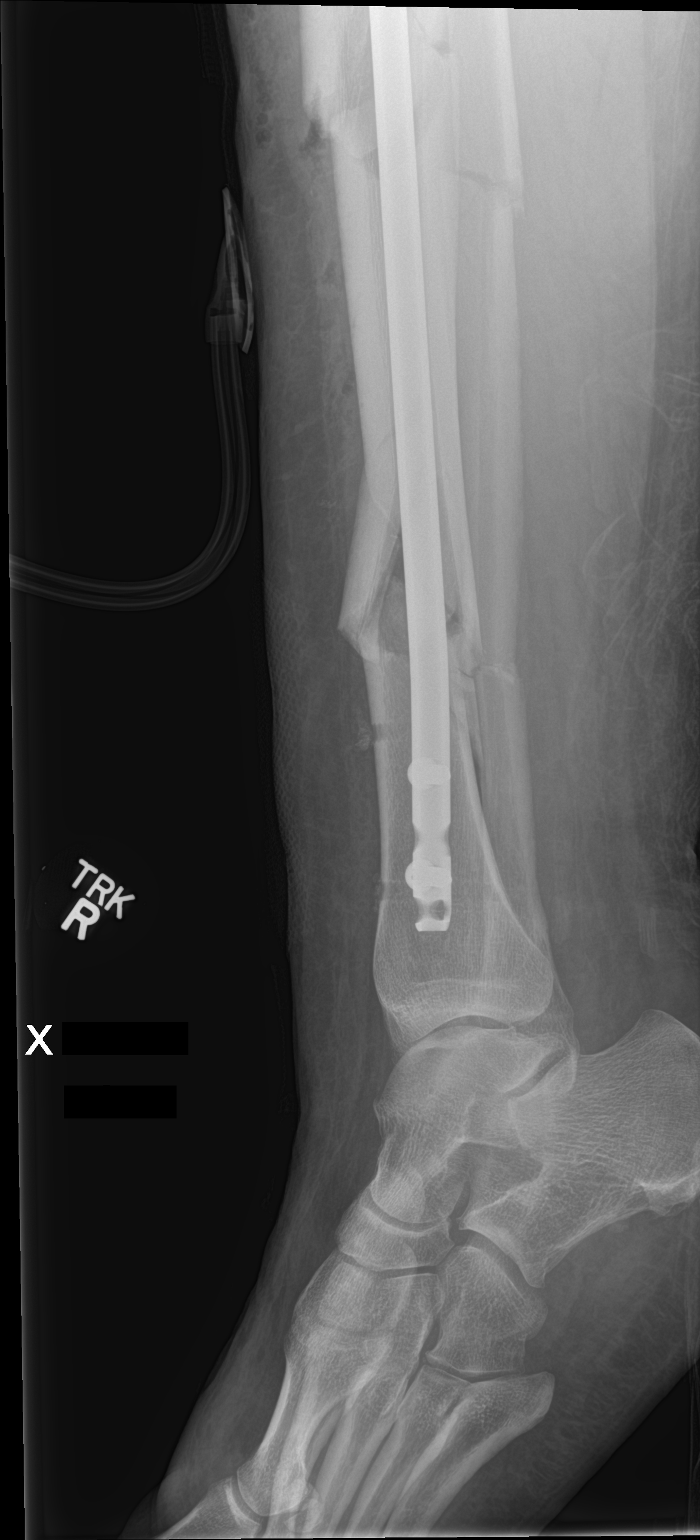

[4 of 4 positions shown; findings below may reference images not displayed]

FINDINGS: Severely comminuted fracture of the mid tibia. Interval placement of
a locking intramedullary rod across the fracture with satisfactory
fracture alignment.

Fractures of the proximal and mid fibula with improved alignment. No
ORIF of the fibula.
IMPRESSION: ORIF right tibia for severely comminuted in multipart fracture.

## 2022-09-18 IMAGING — RF DG TIBIA/FIBULA 2V*R*
1 series · 10 of 10 positions shown · non-contrast
Comparison: 06/23/2021

CLINICAL DATA: ORIF right tibia

EXAM:
RIGHT TIBIA AND FIBULA - 2 VIEW

[Series 1: run · 10 of 10 slices shown]
[im 1/10]
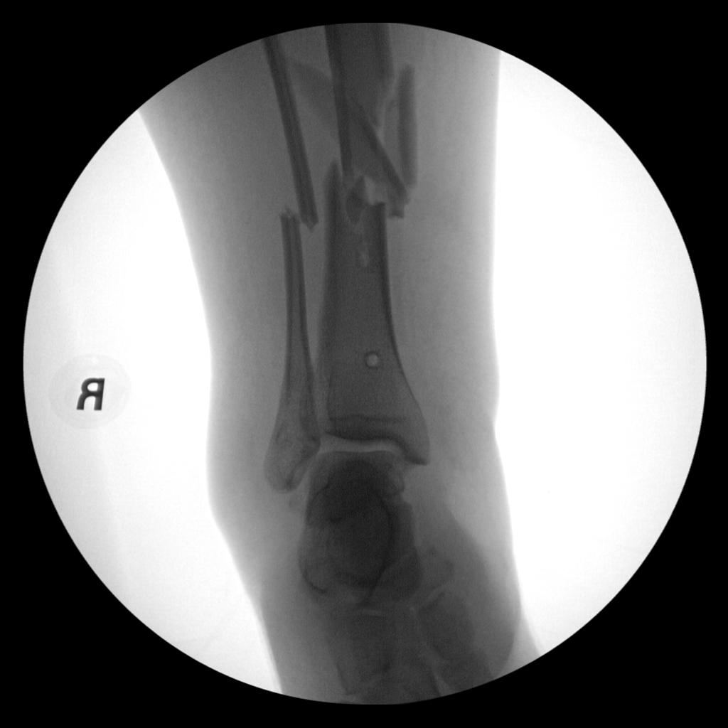
[im 2/10]
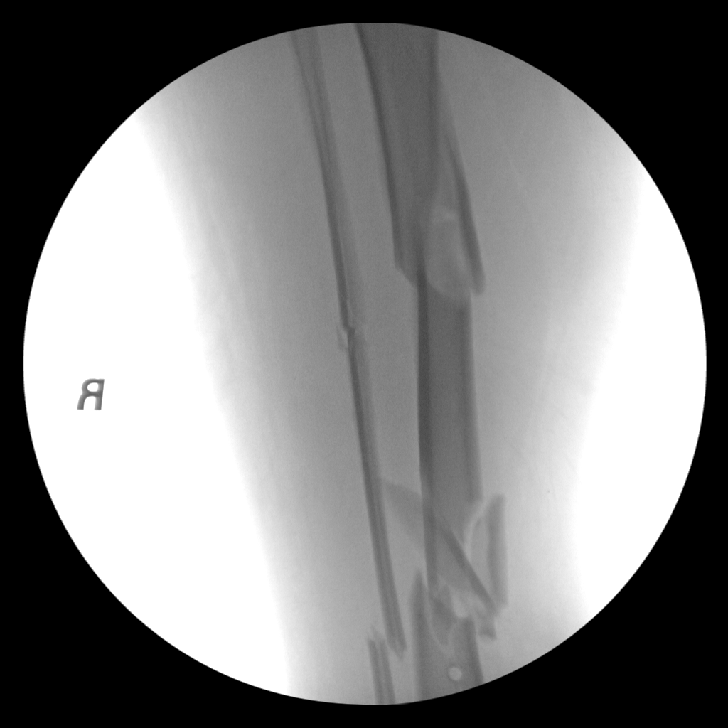
[im 3/10]
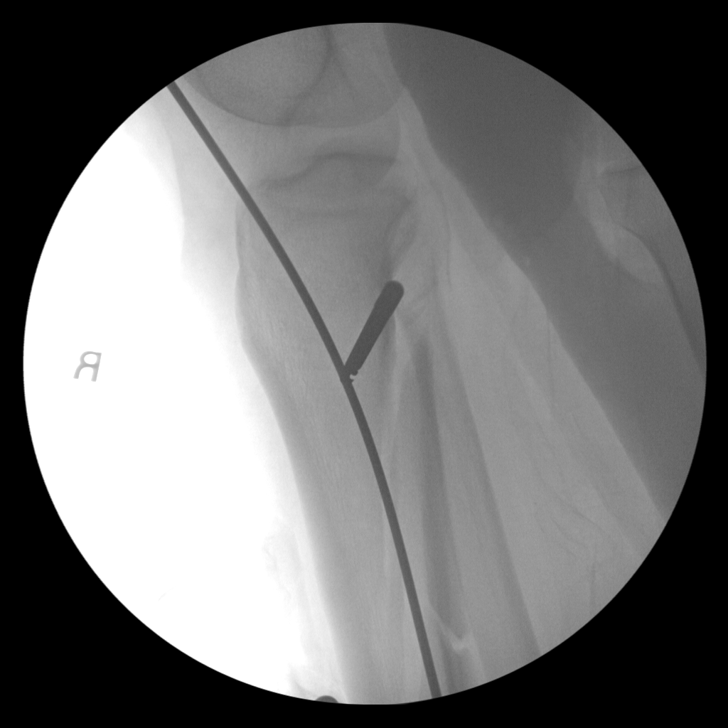
[im 4/10]
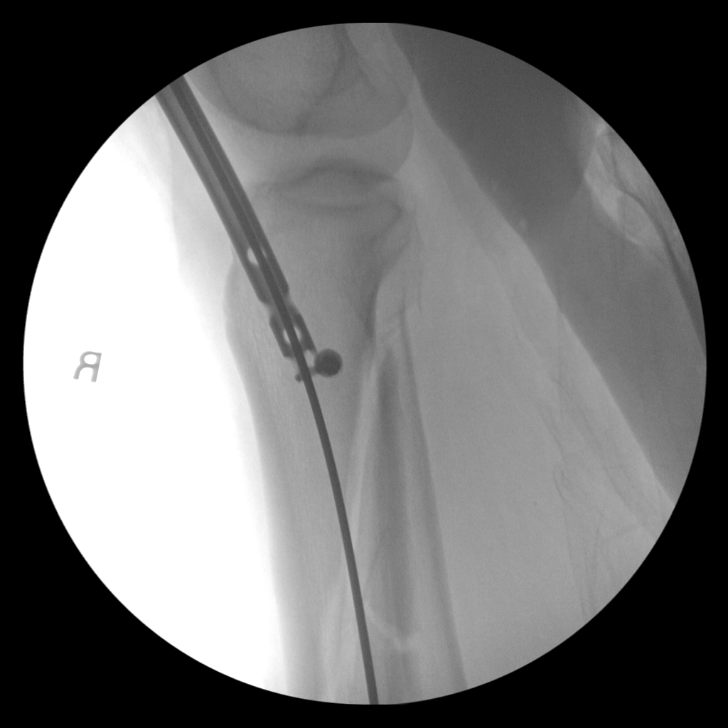
[im 5/10]
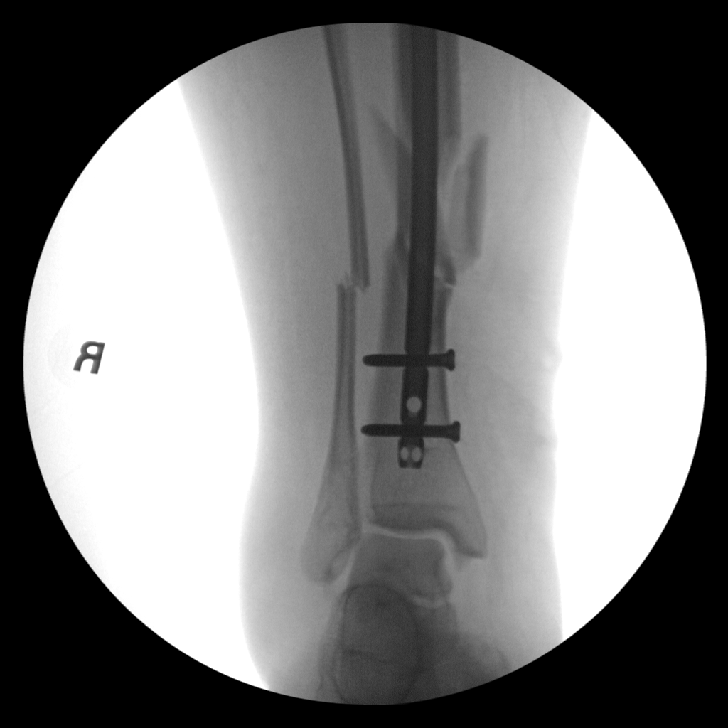
[im 6/10]
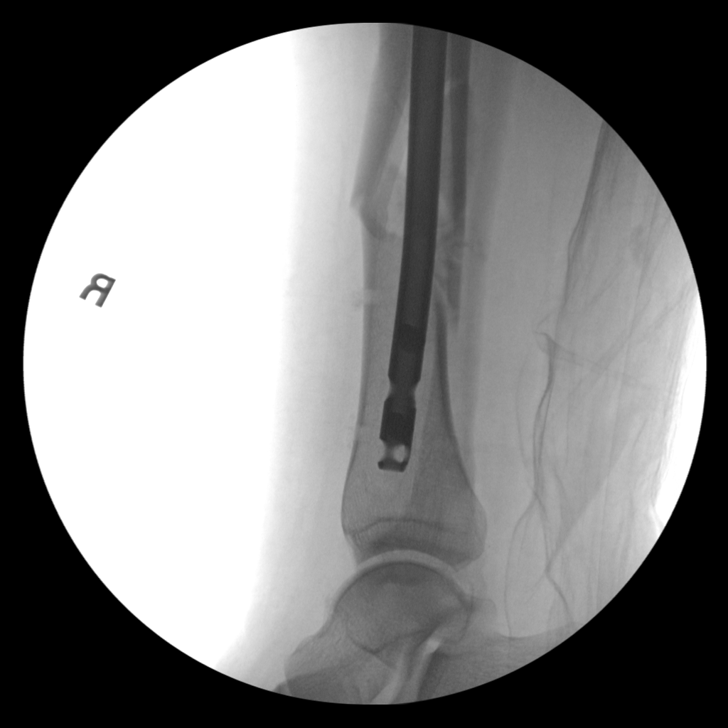
[im 7/10]
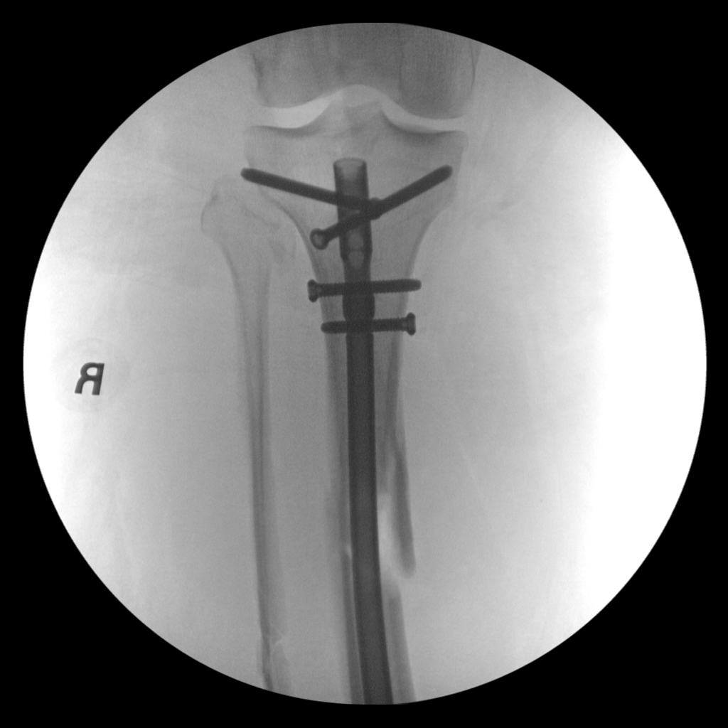
[im 8/10]
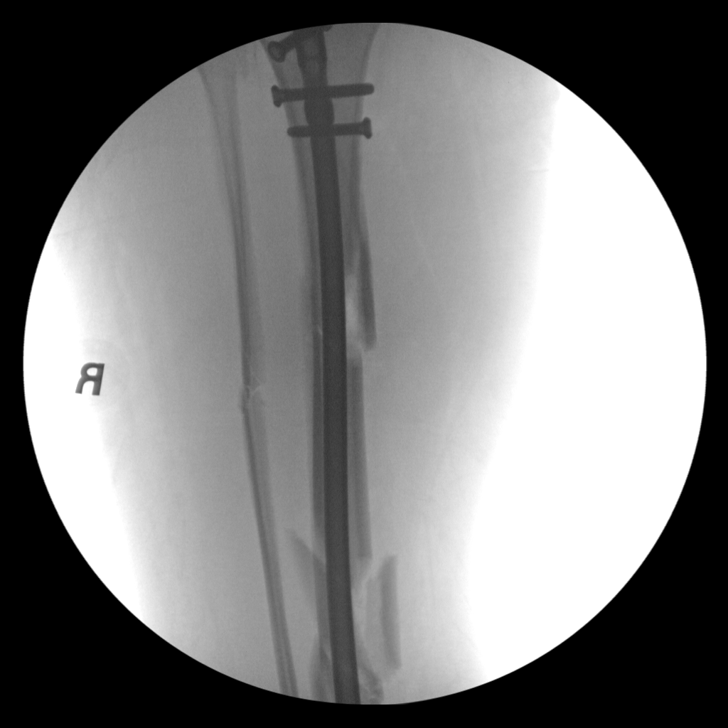
[im 9/10]
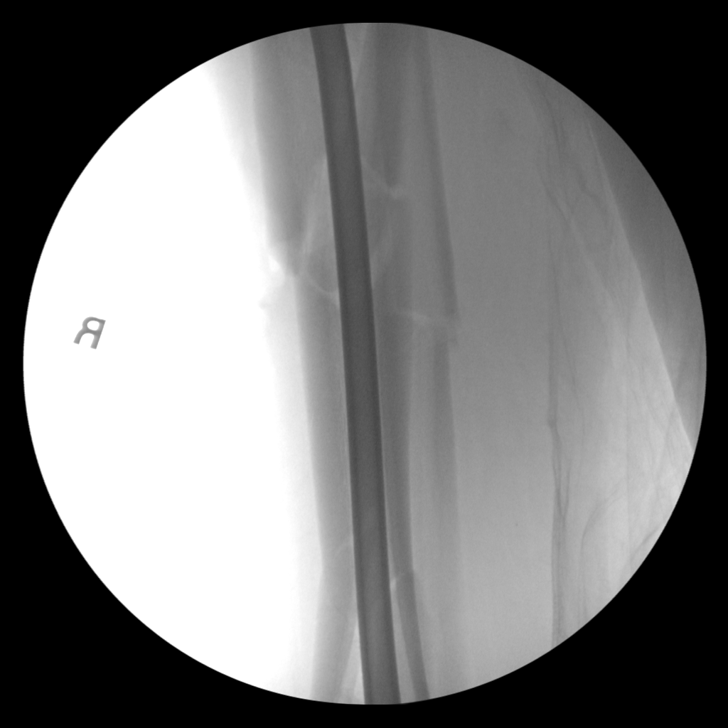
[im 10/10]
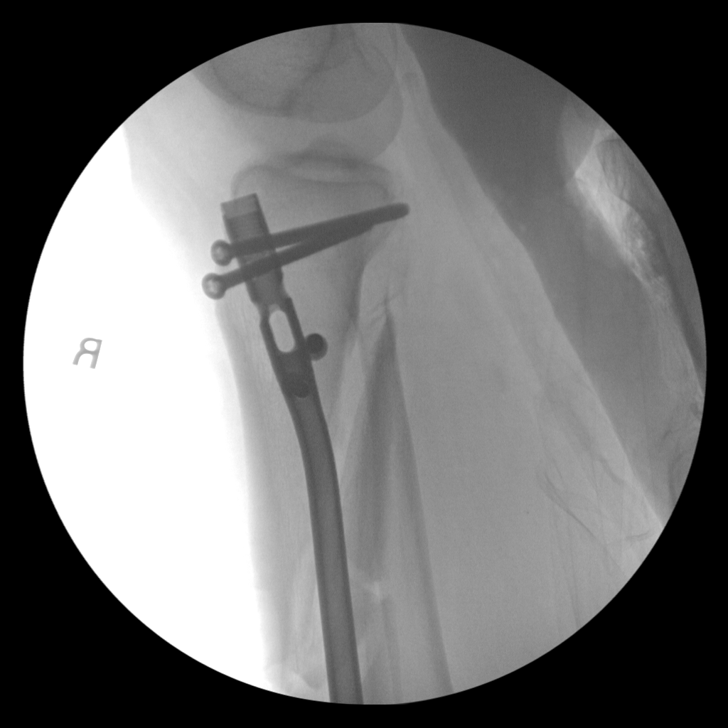

[10 of 10 positions shown; findings below may reference images not displayed]

FINDINGS: Comminuted fracture mid shaft of the right tibia. Interval ORIF with
locking intramedullary rod. Satisfactory fracture alignment.

Fracture mid fibula with improved alignment. No fixation of the
fibular fractures.
IMPRESSION: Comminuted fracture tibia with interval locking intramedullary rod.

## 2022-09-19 IMAGING — DX DG PORTABLE PELVIS
1 series · 2 of 2 positions shown · non-contrast
Comparison: Pelvic radiograph 06/22/2021. Intraoperative
fluoroscopic images [id number].

CLINICAL DATA: Status post left hip replacement.

EXAM:
PORTABLE PELVIS 1-2 VIEWS

[Series 1: pelvis · 0.14mm/px · 2 of 2 slices shown]
[im 1/2]
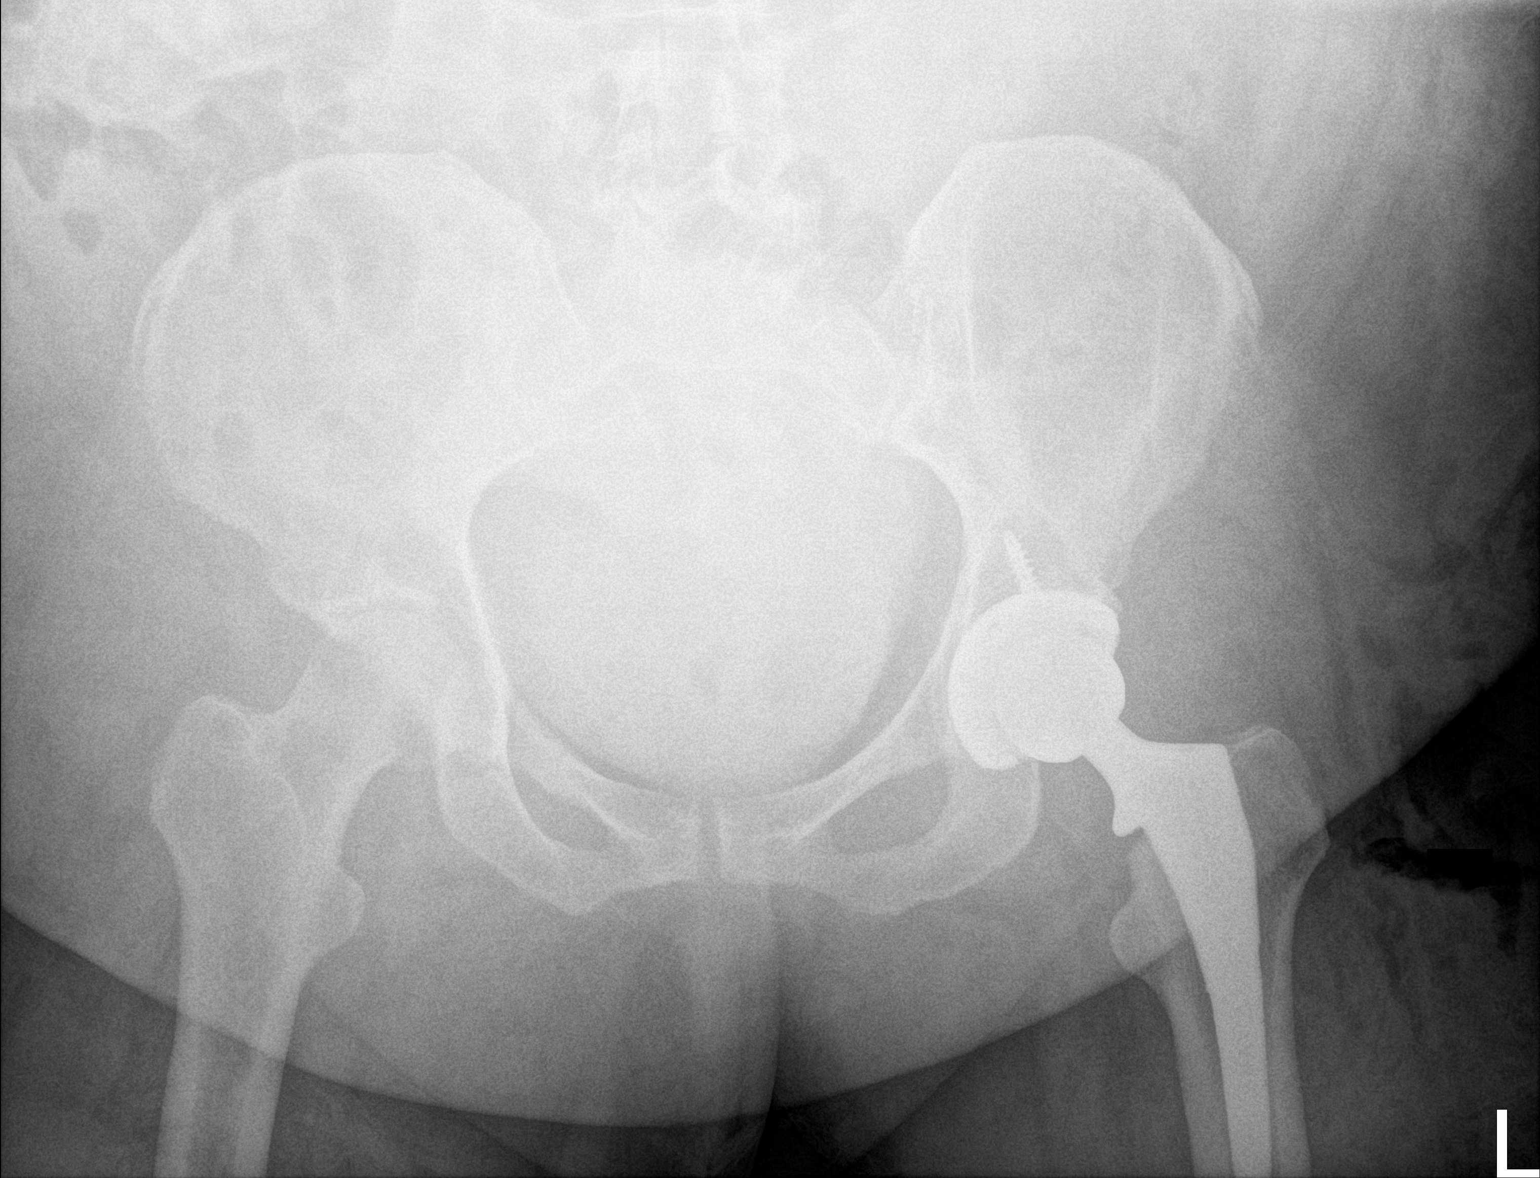
[im 2/2]
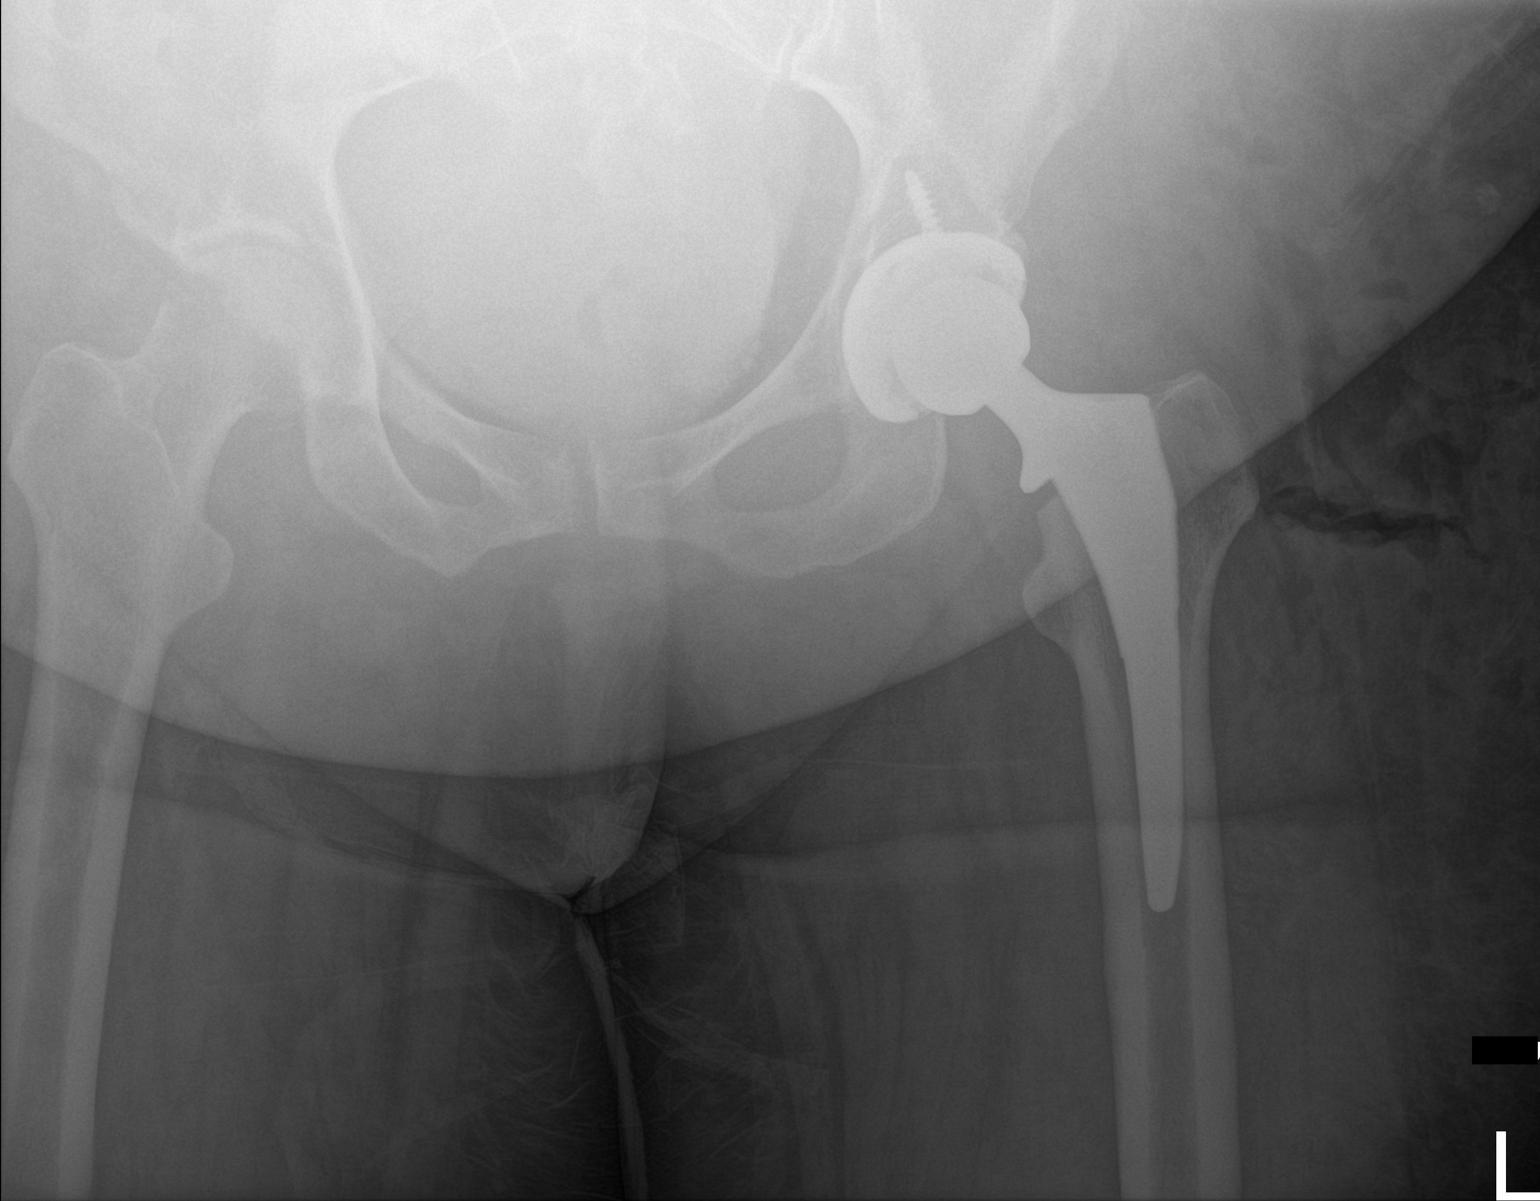

[2 of 2 positions shown; findings below may reference images not displayed]

FINDINGS: Sequelae of left total hip arthroplasty are identified. The
prosthetic components appear normally located on these AP images.
Postoperative gas is noted in the adjacent soft tissues. No acute
fracture is identified.
IMPRESSION: Left total hip arthroplasty.
